# Patient Record
Sex: Female | Born: 1950 | Race: White | Hispanic: No | Smoking: Never smoker
Health system: Southern US, Community
[De-identification: ages and names within clinical notes are randomized; demographics above are authoritative.]

## PROBLEM LIST (undated history)

## (undated) DIAGNOSIS — C4491 Basal cell carcinoma of skin, unspecified: Secondary | ICD-10-CM

## (undated) DIAGNOSIS — I471 Supraventricular tachycardia: Principal | ICD-10-CM

## (undated) DIAGNOSIS — R030 Elevated blood-pressure reading, without diagnosis of hypertension: Secondary | ICD-10-CM

## (undated) DIAGNOSIS — E063 Autoimmune thyroiditis: Secondary | ICD-10-CM

## (undated) DIAGNOSIS — Z9289 Personal history of other medical treatment: Secondary | ICD-10-CM

## (undated) DIAGNOSIS — C439 Malignant melanoma of skin, unspecified: Secondary | ICD-10-CM

## (undated) HISTORY — DX: Elevated blood-pressure reading, without diagnosis of hypertension: R03.0

## (undated) HISTORY — DX: Basal cell carcinoma of skin, unspecified: C44.91

## (undated) HISTORY — DX: Personal history of other medical treatment: Z92.89

## (undated) HISTORY — DX: Supraventricular tachycardia: I47.1

## (undated) HISTORY — DX: Autoimmune thyroiditis: E06.3

---

## 1998-05-15 ENCOUNTER — Ambulatory Visit (HOSPITAL_COMMUNITY): Admission: RE | Admit: 1998-05-15 | Discharge: 1998-05-15 | Payer: Self-pay | Admitting: Obstetrics & Gynecology

## 1999-05-28 ENCOUNTER — Other Ambulatory Visit: Admission: RE | Admit: 1999-05-28 | Discharge: 1999-05-28 | Payer: Self-pay | Admitting: Obstetrics & Gynecology

## 1999-05-28 ENCOUNTER — Ambulatory Visit (HOSPITAL_COMMUNITY): Admission: RE | Admit: 1999-05-28 | Discharge: 1999-05-28 | Payer: Self-pay | Admitting: Obstetrics & Gynecology

## 1999-05-28 ENCOUNTER — Encounter: Payer: Self-pay | Admitting: Obstetrics & Gynecology

## 2000-04-25 DIAGNOSIS — I471 Supraventricular tachycardia, unspecified: Secondary | ICD-10-CM

## 2000-04-25 HISTORY — DX: Supraventricular tachycardia: I47.1

## 2000-04-25 HISTORY — DX: Supraventricular tachycardia, unspecified: I47.10

## 2000-09-21 ENCOUNTER — Other Ambulatory Visit: Admission: RE | Admit: 2000-09-21 | Discharge: 2000-09-21 | Payer: Self-pay | Admitting: Obstetrics & Gynecology

## 2002-06-28 ENCOUNTER — Other Ambulatory Visit: Admission: RE | Admit: 2002-06-28 | Discharge: 2002-06-28 | Payer: Self-pay | Admitting: Obstetrics and Gynecology

## 2003-10-09 ENCOUNTER — Other Ambulatory Visit: Admission: RE | Admit: 2003-10-09 | Discharge: 2003-10-09 | Payer: Self-pay | Admitting: Obstetrics & Gynecology

## 2004-10-26 HISTORY — PX: NECK SURGERY: SHX720

## 2004-10-26 HISTORY — PX: BUNIONECTOMY: SHX129

## 2005-01-20 ENCOUNTER — Other Ambulatory Visit: Admission: RE | Admit: 2005-01-20 | Discharge: 2005-01-20 | Payer: Self-pay | Admitting: Obstetrics and Gynecology

## 2006-02-16 ENCOUNTER — Other Ambulatory Visit: Admission: RE | Admit: 2006-02-16 | Discharge: 2006-02-16 | Payer: Self-pay | Admitting: Obstetrics & Gynecology

## 2009-03-04 ENCOUNTER — Ambulatory Visit (HOSPITAL_COMMUNITY): Admission: RE | Admit: 2009-03-04 | Discharge: 2009-03-04 | Payer: Self-pay | Admitting: Pulmonary Disease

## 2009-03-12 ENCOUNTER — Encounter (INDEPENDENT_AMBULATORY_CARE_PROVIDER_SITE_OTHER): Payer: Self-pay | Admitting: Diagnostic Radiology

## 2009-03-12 ENCOUNTER — Ambulatory Visit (HOSPITAL_COMMUNITY): Admission: RE | Admit: 2009-03-12 | Discharge: 2009-03-12 | Payer: Self-pay | Admitting: Pulmonary Disease

## 2009-10-26 DIAGNOSIS — Z9289 Personal history of other medical treatment: Secondary | ICD-10-CM

## 2009-10-26 HISTORY — DX: Personal history of other medical treatment: Z92.89

## 2010-05-01 IMAGING — US US BIOPSY
1 series · 10 of 10 positions shown · non-contrast
Comparison: none

CLINICAL DATA: Cystic left thyroid mass with mural nodule

ULTRASOUND GUIDED FINE NEEDLE ASPIRATION BIOPSY OF THYROID NODULE:
TECHNIQUE: Written informed consent for procedure obtained after discussion of
procedure and risks.
Time-out protocol performed.
Left lobe thyroid mass containing cystic and solid components was
localized by ultrasound.
Mass measures 2.8 x 1.8 by 1.9 cm in size.

[Series 1: unknown · 0.07mm/px · 10 of 10 slices shown]
[im 1/10]
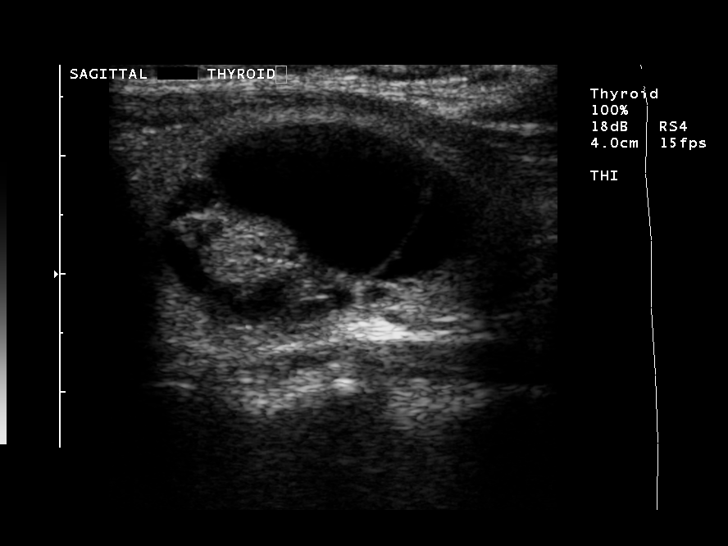
[im 2/10]
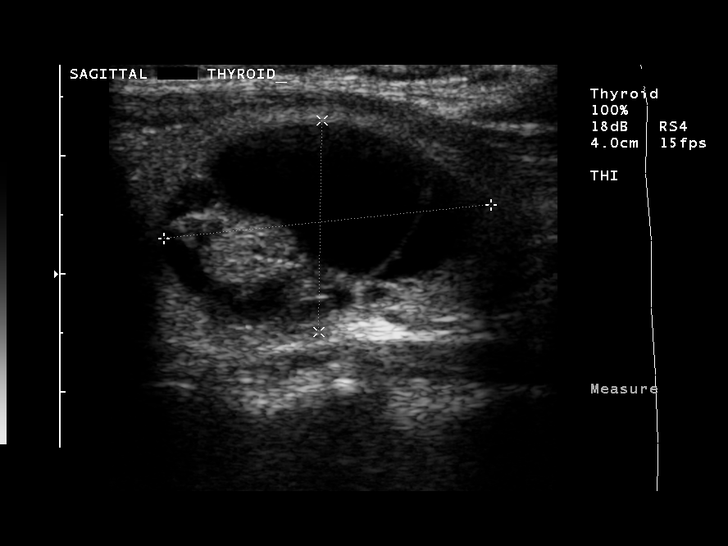
[im 3/10]
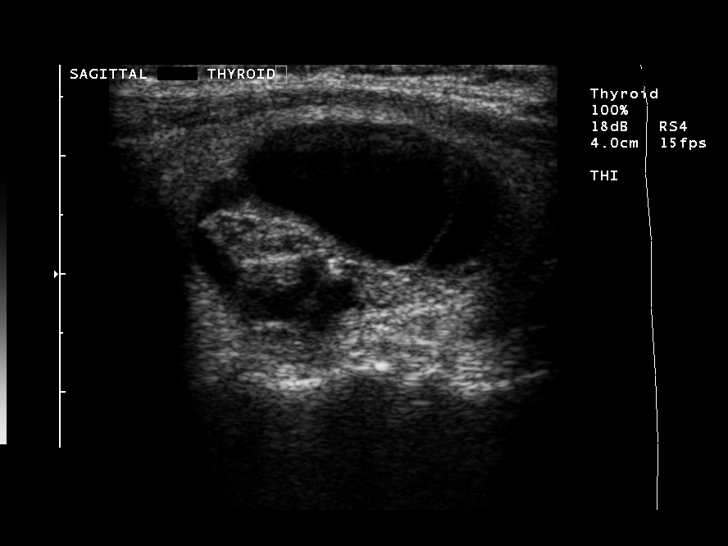
[im 4/10]
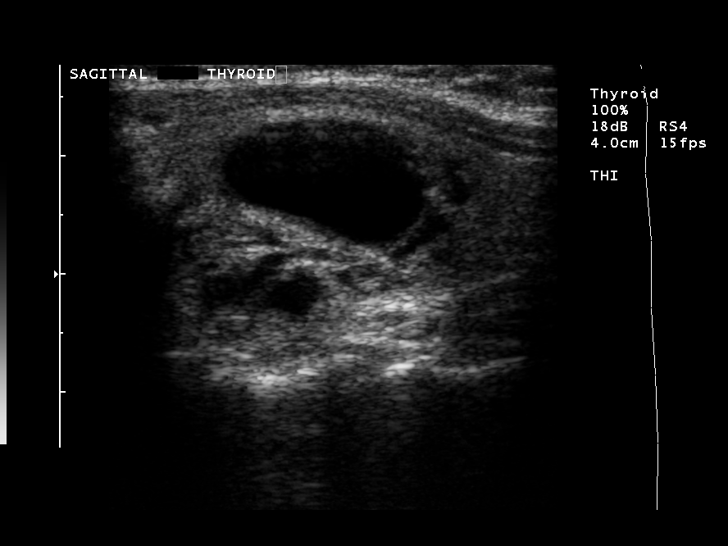
[im 5/10]
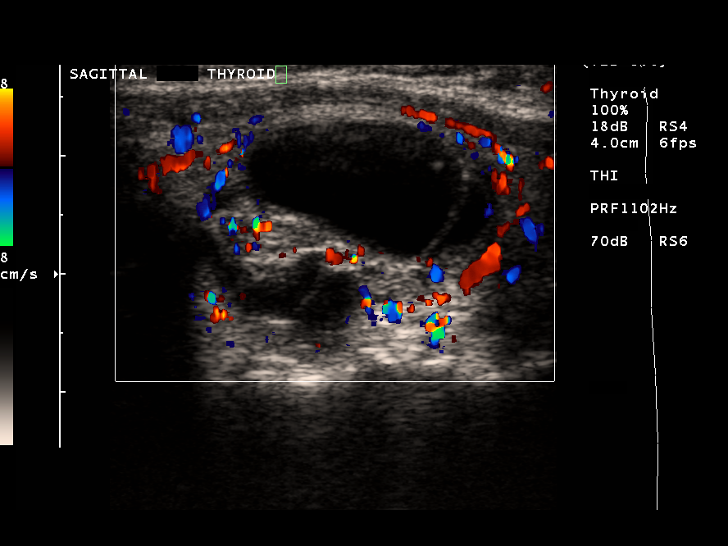
[im 6/10]
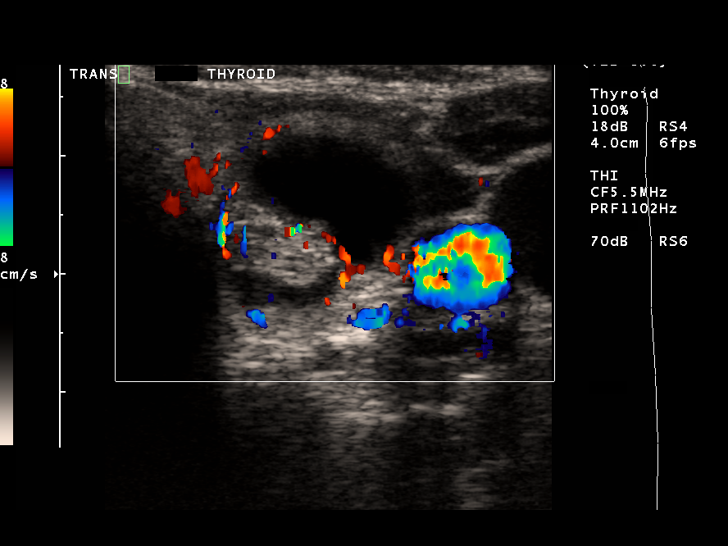
[im 7/10]
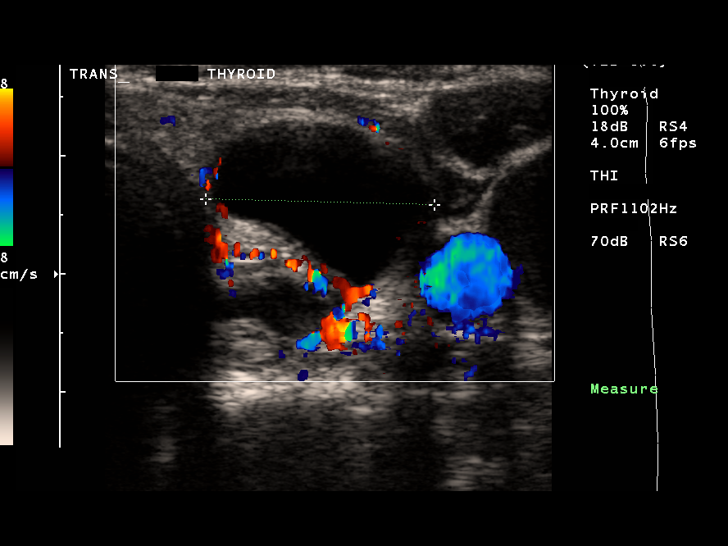
[im 8/10]
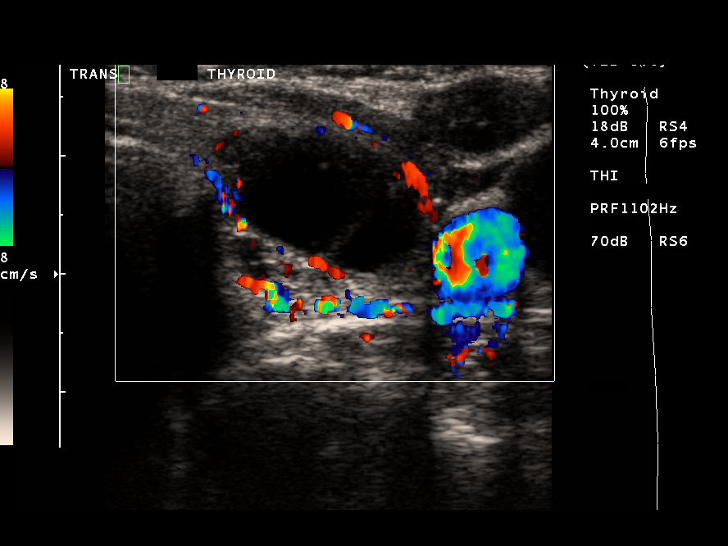
[im 9/10]
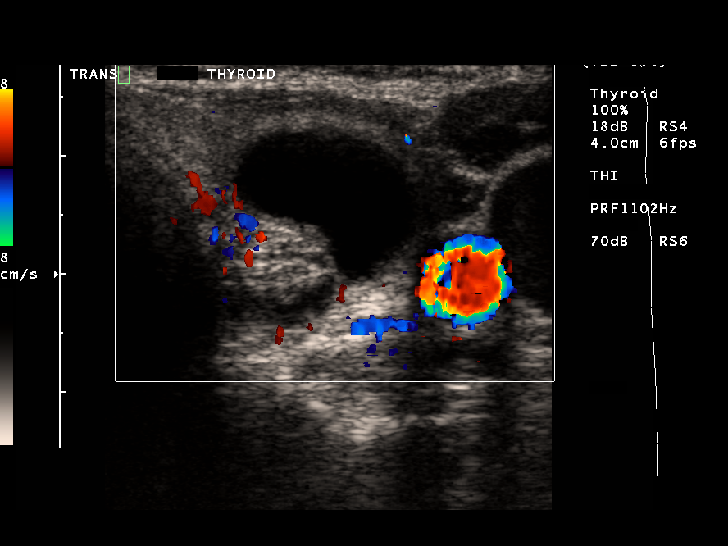
[im 10/10]
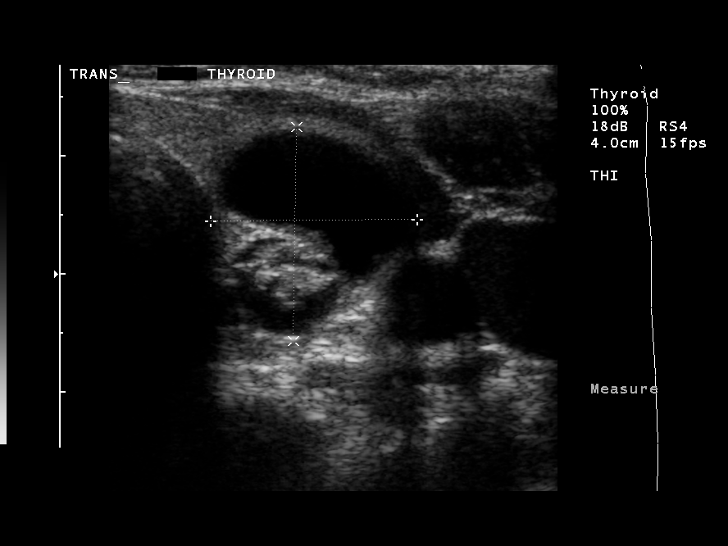

[10 of 10 positions shown; findings below may reference images not displayed]

Skin prepped and draped in usual sterile fashion.
Skin and soft tissues anesthestized with 1.5 ml of 1% lidocaine.
Under sonographic guidance, three 25-gauge fine needle aspiration
biopsies of the mural nodule of the left thyroid mass were
performed.
Additionally, following the three FNA samples, aspiration of the
cystic component was performed.
Procedure tolerated well by patient without immediate complication.
Postprocedural images demonstrate small amount of bleeding
internally into the aspirated cystic cavity.
Standard post procedure instructions were given to patient.
Specimen sent to laboratory for analysis.
IMPRESSION: Ultrasound guided fine needle aspiration biopsy of a complicated
cystic left lobe thyroid mass.

## 2010-06-24 ENCOUNTER — Encounter: Admission: RE | Admit: 2010-06-24 | Discharge: 2010-06-24 | Payer: Self-pay | Admitting: Obstetrics & Gynecology

## 2011-05-29 ENCOUNTER — Other Ambulatory Visit (HOSPITAL_COMMUNITY): Payer: Self-pay | Admitting: Internal Medicine

## 2011-05-29 ENCOUNTER — Ambulatory Visit (HOSPITAL_COMMUNITY)
Admission: RE | Admit: 2011-05-29 | Discharge: 2011-05-29 | Disposition: A | Discharge status: Home / Self Care | Payer: BC Managed Care – PPO | Source: Ambulatory Visit | Attending: Internal Medicine | Admitting: Internal Medicine

## 2011-05-29 DIAGNOSIS — R05 Cough: Secondary | ICD-10-CM | POA: Insufficient documentation

## 2011-05-29 DIAGNOSIS — R059 Cough, unspecified: Secondary | ICD-10-CM | POA: Insufficient documentation

## 2011-05-29 DIAGNOSIS — R918 Other nonspecific abnormal finding of lung field: Secondary | ICD-10-CM | POA: Insufficient documentation

## 2011-05-29 DIAGNOSIS — R52 Pain, unspecified: Secondary | ICD-10-CM

## 2011-05-29 DIAGNOSIS — R0789 Other chest pain: Secondary | ICD-10-CM | POA: Insufficient documentation

## 2011-05-29 DIAGNOSIS — M25519 Pain in unspecified shoulder: Secondary | ICD-10-CM | POA: Insufficient documentation

## 2011-06-22 ENCOUNTER — Other Ambulatory Visit (HOSPITAL_COMMUNITY): Payer: Self-pay | Admitting: Internal Medicine

## 2011-06-22 ENCOUNTER — Ambulatory Visit (HOSPITAL_COMMUNITY)
Admission: RE | Admit: 2011-06-22 | Discharge: 2011-06-22 | Disposition: A | Discharge status: Home / Self Care | Payer: BC Managed Care – PPO | Source: Ambulatory Visit | Attending: Internal Medicine | Admitting: Internal Medicine

## 2011-06-22 DIAGNOSIS — R0789 Other chest pain: Secondary | ICD-10-CM | POA: Insufficient documentation

## 2011-06-22 DIAGNOSIS — Z8701 Personal history of pneumonia (recurrent): Secondary | ICD-10-CM | POA: Insufficient documentation

## 2011-06-22 DIAGNOSIS — I10 Essential (primary) hypertension: Secondary | ICD-10-CM | POA: Insufficient documentation

## 2011-07-07 ENCOUNTER — Ambulatory Visit (HOSPITAL_COMMUNITY)
Admission: RE | Admit: 2011-07-07 | Discharge: 2011-07-07 | Disposition: A | Discharge status: Home / Self Care | Payer: BC Managed Care – PPO | Source: Ambulatory Visit | Attending: Internal Medicine | Admitting: Internal Medicine

## 2011-07-07 ENCOUNTER — Other Ambulatory Visit (HOSPITAL_COMMUNITY): Payer: Self-pay | Admitting: Internal Medicine

## 2011-07-07 DIAGNOSIS — R918 Other nonspecific abnormal finding of lung field: Secondary | ICD-10-CM | POA: Insufficient documentation

## 2011-07-07 DIAGNOSIS — J189 Pneumonia, unspecified organism: Secondary | ICD-10-CM

## 2011-07-07 DIAGNOSIS — R0789 Other chest pain: Secondary | ICD-10-CM | POA: Insufficient documentation

## 2012-10-06 ENCOUNTER — Other Ambulatory Visit: Payer: Self-pay | Admitting: Obstetrics & Gynecology

## 2012-10-06 DIAGNOSIS — R928 Other abnormal and inconclusive findings on diagnostic imaging of breast: Secondary | ICD-10-CM

## 2012-10-11 ENCOUNTER — Ambulatory Visit
Admission: RE | Admit: 2012-10-11 | Discharge: 2012-10-11 | Disposition: A | Discharge status: Home / Self Care | Payer: BC Managed Care – PPO | Source: Ambulatory Visit | Attending: Obstetrics & Gynecology | Admitting: Obstetrics & Gynecology

## 2012-10-11 DIAGNOSIS — R928 Other abnormal and inconclusive findings on diagnostic imaging of breast: Secondary | ICD-10-CM

## 2012-10-14 ENCOUNTER — Other Ambulatory Visit: Payer: BC Managed Care – PPO

## 2013-03-08 ENCOUNTER — Other Ambulatory Visit: Payer: Self-pay | Admitting: Obstetrics & Gynecology

## 2013-03-08 DIAGNOSIS — R921 Mammographic calcification found on diagnostic imaging of breast: Secondary | ICD-10-CM

## 2013-04-07 ENCOUNTER — Ambulatory Visit
Admission: RE | Admit: 2013-04-07 | Discharge: 2013-04-07 | Disposition: A | Discharge status: Home / Self Care | Payer: BC Managed Care – PPO | Source: Ambulatory Visit | Attending: Obstetrics & Gynecology | Admitting: Obstetrics & Gynecology

## 2013-04-07 DIAGNOSIS — R921 Mammographic calcification found on diagnostic imaging of breast: Secondary | ICD-10-CM

## 2013-07-06 ENCOUNTER — Encounter: Payer: Self-pay | Admitting: Cardiology

## 2013-07-06 ENCOUNTER — Ambulatory Visit (INDEPENDENT_AMBULATORY_CARE_PROVIDER_SITE_OTHER): Payer: BC Managed Care – PPO | Admitting: Cardiology

## 2013-07-06 VITALS — BP 140/88 | HR 51 | Ht 62.5 in | Wt 119.3 lb

## 2013-07-06 DIAGNOSIS — I471 Supraventricular tachycardia: Secondary | ICD-10-CM

## 2013-07-06 DIAGNOSIS — R030 Elevated blood-pressure reading, without diagnosis of hypertension: Secondary | ICD-10-CM

## 2013-07-06 NOTE — Patient Instructions (Addendum)
If you have extra beats  You may split you dose   Metoprolol and extra dose   Your physician wants you to follow-up in [address] Dr Leola Brazil will receive a reminder letter in the mail two months in advance. If you don't receive a letter, please call our office to schedule the follow-up appointment.

## 2013-07-07 ENCOUNTER — Encounter: Payer: Self-pay | Admitting: Cardiology

## 2013-07-07 ENCOUNTER — Other Ambulatory Visit: Payer: Self-pay | Admitting: Dermatology

## 2013-07-11 ENCOUNTER — Other Ambulatory Visit: Payer: Self-pay | Admitting: Cardiology

## 2013-07-11 NOTE — Telephone Encounter (Signed)
Rx was sent to pharmacy electronically. 

## 2013-07-17 ENCOUNTER — Encounter: Payer: Self-pay | Admitting: Cardiology

## 2013-07-17 DIAGNOSIS — R030 Elevated blood-pressure reading, without diagnosis of hypertension: Secondary | ICD-10-CM | POA: Insufficient documentation

## 2013-07-17 DIAGNOSIS — E063 Autoimmune thyroiditis: Secondary | ICD-10-CM | POA: Insufficient documentation

## 2013-07-17 NOTE — Progress Notes (Signed)
PCP: Carylon Perches, MD  Clinic Note: Chief Complaint  Patient presents with  . ROV 1 year    Heart rate increases at times, she can feel when she goes into SVT, has sensations in her legs at night and irritation on lower legs-was told it had something to do with circulation    HPI: Morgan [name] is a 62 y.o. female with a PMH below who presents today for followup of her SVT and PACs. She's been up titrated to 50 mg twice a day of metoprolol tartrate  Interval History: [name] presents today doing quite well overall from a cardiac standpoint. She still has occasional episodes where she feels her heart will go up and go quite fast. Usually, she can break them with cough or Valsalva maneuver. They don't last too long, and not enough to cause near-syncope or syncope symptoms.  Otherwise, she does as before significantly improved energy levels on the beta blocker. He is able to exercise with greater frequency. She denies any chest pain or shortness breath at rest or exertion no dyspnea at rest or exertion.  Despite her rapid heart rate, she denies any significant dizziness, wooziness, syncope or Near-syncope. No TIA/amaurosis fugax symptoms.  The remainder of Cardiovascular ROS: negative for - edema, loss of consciousness, murmur, orthopnea or paroxysmal nocturnal dyspnea is as follows: Additional cardiac review of systems: Melena - no, hematochezia no; hematuria - no; nosebleeds - no; claudication - no  Past Medical History  Diagnosis Date  . 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 Borderline hypertension   . Hashimoto's thyroiditis   . Basal cell carcinoma   . H/O echocardiogram 2011    Normal ejection fraction greater than 50%. No wall motion abnormalities no valvular lesions.    Prior Cardiac Evaluation and Past Surgical History: Past Surgical History  Procedure Laterality Date  . Bunionectomy  2006  . Neck surgery   2006    Cosmetic    No Known Allergies  Current Outpatient Prescriptions  Medication Sig Dispense Refill  . aspirin EC 81 MG tablet Take 81 mg by mouth daily.      . calcium carbonate (OS-CAL) 600 MG TABS tablet Take 600 mg by mouth daily.      . cholecalciferol (VITAMIN D) 1000 UNITS tablet Take 1,000 Units by mouth daily.      Marland Kitchen estradiol (ESTRACE) 1 MG tablet Take 1 tablet by mouth daily.      . fish oil-omega-3 fatty acids 1000 MG capsule Take 1 g by mouth daily.      . Multiple Vitamin (MULTIVITAMIN) tablet Take 1 tablet by mouth daily.      Marland Kitchen PROMETRIUM 200 MG capsule Take 1 capsule by mouth every 3 (three) months.      . vitamin E 400 UNIT capsule Take 400 Units by mouth daily.      . metoprolol (LOPRESSOR) 50 MG tablet take 1 tablet by mouth twice a day  60 tablet  11   No current facility-administered medications for this visit.    History   Social History Narrative   Married, mother of 3.   Actively exercising, enjoys running and doing aerobics at the gym. Actually notes that her energy while being on a beta blocker then off.   She exercises in the gym and also runs outside.   Does not smoke, or drink alcohol.    ROS: A comprehensive Review of Systems -  Negative except Pertinent positives noted above and mild symptom below Neurological ROS: Intermittent tingling in her legs and arms.  PHYSICAL EXAM BP 140/88  Pulse 51  Ht 5' 2.5" (1.588 m)  Wt 119 lb 4.8 oz (54.114 kg)  BMI 21.46 kg/m2 General appearance: alert, cooperative, appears stated age, no distress and Healthy-appearing, normal mood and affect. Well-nourished and well-groomed. Neck: no adenopathy, no carotid bruit, no JVD, supple, symmetrical, trachea midline and thyroid not enlarged, symmetric, no tenderness/mass/nodules Lungs: clear to auscultation bilaterally, normal percussion bilaterally and Nonlabored, good air movement Heart: regular rate and rhythm, S1, S2 normal, no murmur, click, rub or gallop and  normal apical impulse Abdomen: soft, non-tender; bowel sounds normal; no masses,  no organomegaly Extremities: extremities normal, atraumatic, no cyanosis or edema, no edema, redness or tenderness in the calves or thighs and no ulcers, gangrene or trophic changes Pulses: 2+ and symmetric Neurologic: Alert and oriented X 3, normal strength and tone. Normal symmetric reflexes. Normal coordination and gait HEENT: [place]/AT, EOMI, MMM, anicteric sclera   WUJ:[id number] today: Yes Rate: 51 , Rhythm: Sinus bradycardia, otherwise normal ECG;   Recent Labs: None  ASSESSMENT / PLAN: Paroxysmal SVT (supraventricular tachycardia) She is overall doing fairly well. Her family is somewhat concerned that these could be more serious. They have been confused with atrial fibrillation.  I reassured her that it is okay.  We went over maneuvers so that a break these  rhythms and when they occur. I simply cannot increase the beta blocker anymore. As he's not having any syncope or near-syncope symptoms with them I don't think there is any need to send her for ablation. When I did tell her she can do is potentially split her metoprolol and take one half a pill early if she has an episode of headache the remainder the dose as normally scheduled. She could even potentially take a full pill as normally scheduled if she is not noticing any lightheadedness or dizziness.  I did remind her that any prolonged episodes of very beneficial for emergent subjective to break the rhythm.  Borderline hypertension Her blood pressure is actually a bit high today. Has not been high in the past, she may simply be more sedated today or anxious.  I am not overly inclined on treating this low level of blood pressure as she is currently on a pre-high-dose of metoprolol. I will continue her current regimen. I don't think she has any more room to increase beta blocker dose.     No orders of the defined types were placed in this encounter.     Meds ordered this encounter  Medications  . estradiol (ESTRACE) 1 MG tablet    Sig: Take 1 tablet by mouth daily.  Marland Kitchen DISCONTD: metoprolol (LOPRESSOR) 50 MG tablet    Sig: Take 1 tablet by mouth 2 (two) times daily.  Marland Kitchen PROMETRIUM 200 MG capsule    Sig: Take 1 capsule by mouth every 3 (three) months.  . Multiple Vitamin (MULTIVITAMIN) tablet    Sig: Take 1 tablet by mouth daily.  . calcium carbonate (OS-CAL) 600 MG TABS tablet    Sig: Take 600 mg by mouth daily.  . cholecalciferol (VITAMIN D) 1000 UNITS tablet    Sig: Take 1,000 Units by mouth daily.  . vitamin E 400 UNIT capsule    Sig: Take 400 Units by mouth daily.  . fish oil-omega-3 fatty acids 1000 MG capsule    Sig: Take 1 g by mouth daily.  Marland Kitchen aspirin  EC 81 MG tablet    Sig: Take 81 mg by mouth daily.    Followup: One year  Lyle Leisner W. Herbie Baltimore, M.D., M.S. [facility] [address] [place], [place]  [address]  [phone] Pager # [phone]

## 2013-07-17 NOTE — Assessment & Plan Note (Signed)
Her blood pressure is actually a bit high today. Has not been high in the past, she may simply be more sedated today or anxious.  I am not overly inclined on treating this low level of blood pressure as she is currently on a pre-high-dose of metoprolol. I will continue her current regimen. I don't think she has any more room to increase beta blocker dose.

## 2013-07-17 NOTE — Assessment & Plan Note (Signed)
She is overall doing fairly well. Her family is somewhat concerned that these could be more serious. They have been confused with atrial fibrillation.  I reassured her that it is okay.  We went over maneuvers so that a break these  rhythms and when they occur. I simply cannot increase the beta blocker anymore. As he's not having any syncope or near-syncope symptoms with them I don't think there is any need to send her for ablation. When I did tell her she can do is potentially split her metoprolol and take one half a pill early if she has an episode of headache the remainder the dose as normally scheduled. She could even potentially take a full pill as normally scheduled if she is not noticing any lightheadedness or dizziness.  I did remind her that any prolonged episodes of very beneficial for emergent subjective to break the rhythm.

## 2013-10-02 ENCOUNTER — Other Ambulatory Visit: Payer: Self-pay | Admitting: Obstetrics & Gynecology

## 2013-10-02 DIAGNOSIS — R921 Mammographic calcification found on diagnostic imaging of breast: Secondary | ICD-10-CM

## 2013-10-27 ENCOUNTER — Ambulatory Visit
Admission: RE | Admit: 2013-10-27 | Discharge: 2013-10-27 | Disposition: A | Discharge status: Home / Self Care | Payer: BC Managed Care – PPO | Source: Ambulatory Visit | Attending: Obstetrics & Gynecology | Admitting: Obstetrics & Gynecology

## 2013-10-27 DIAGNOSIS — R921 Mammographic calcification found on diagnostic imaging of breast: Secondary | ICD-10-CM

## 2014-01-09 ENCOUNTER — Other Ambulatory Visit: Payer: Self-pay | Admitting: Dermatology

## 2014-02-07 ENCOUNTER — Other Ambulatory Visit: Payer: Self-pay | Admitting: Dermatology

## 2014-02-21 ENCOUNTER — Other Ambulatory Visit: Payer: Self-pay | Admitting: Dermatology

## 2014-05-16 ENCOUNTER — Telehealth: Payer: Self-pay | Admitting: Cardiology

## 2014-05-16 NOTE — Telephone Encounter (Signed)
Closed encounter °

## 2014-05-27 IMAGING — MG MM DIGITAL DIAGNOSTIC UNILAT*R*
4 series · 4 of 4 positions shown · non-contrast
Comparison: [DATE] [DATE], [DATE], [DATE] [DATE], [DATE], [DATE] [DATE], [DATE],
[DATE] [DATE], [DATE], [DATE] [DATE], [DATE]

CLINICAL DATA: 6-month follow-up calcifications right breast

DIGITAL DIAGNOSTIC RIGHT MAMMOGRAM WITH CAD

[R CC (1 of 2)]
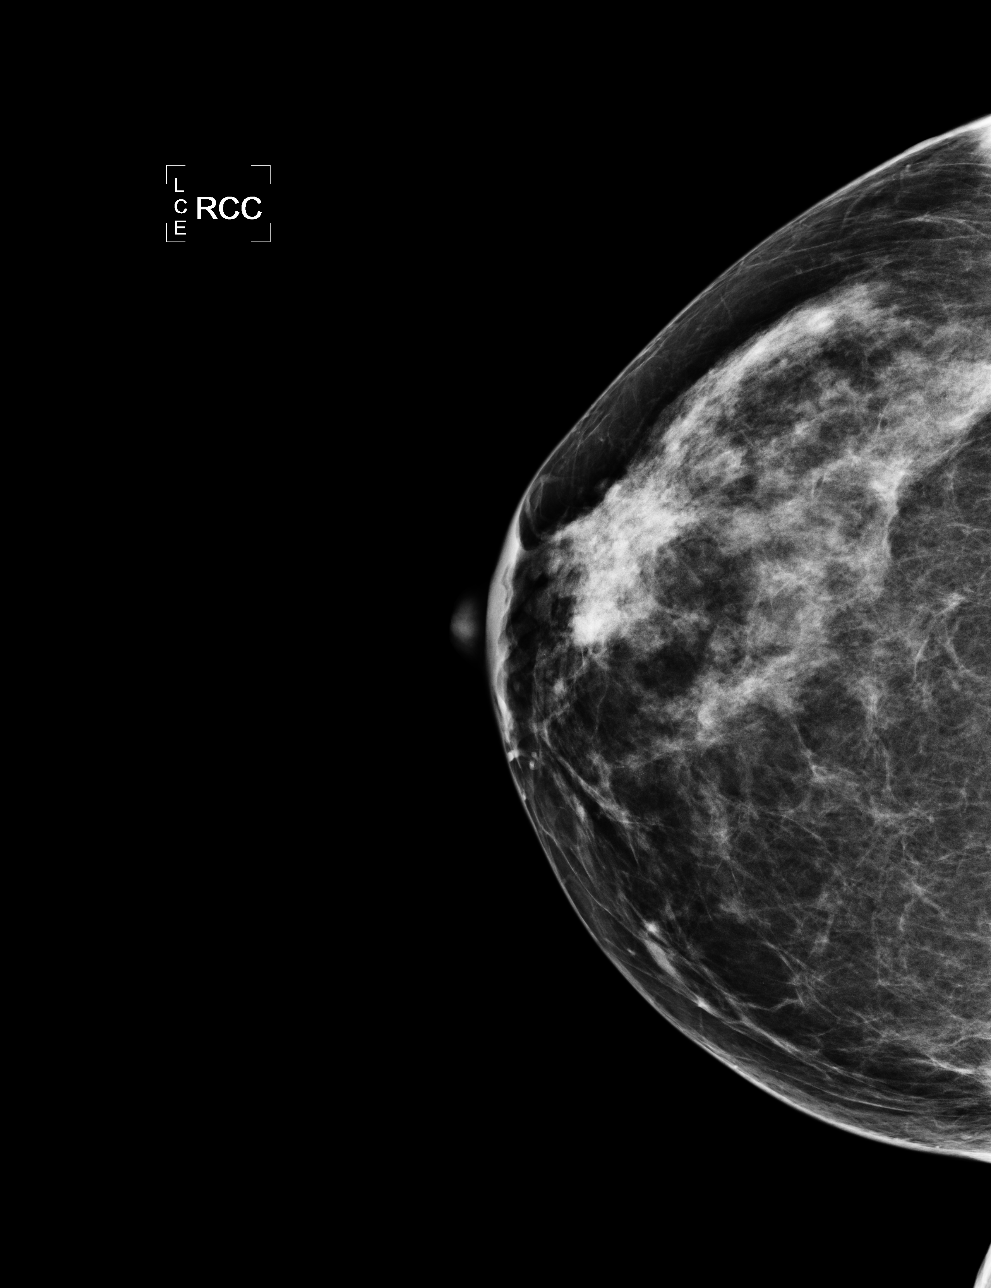

[R MLO]
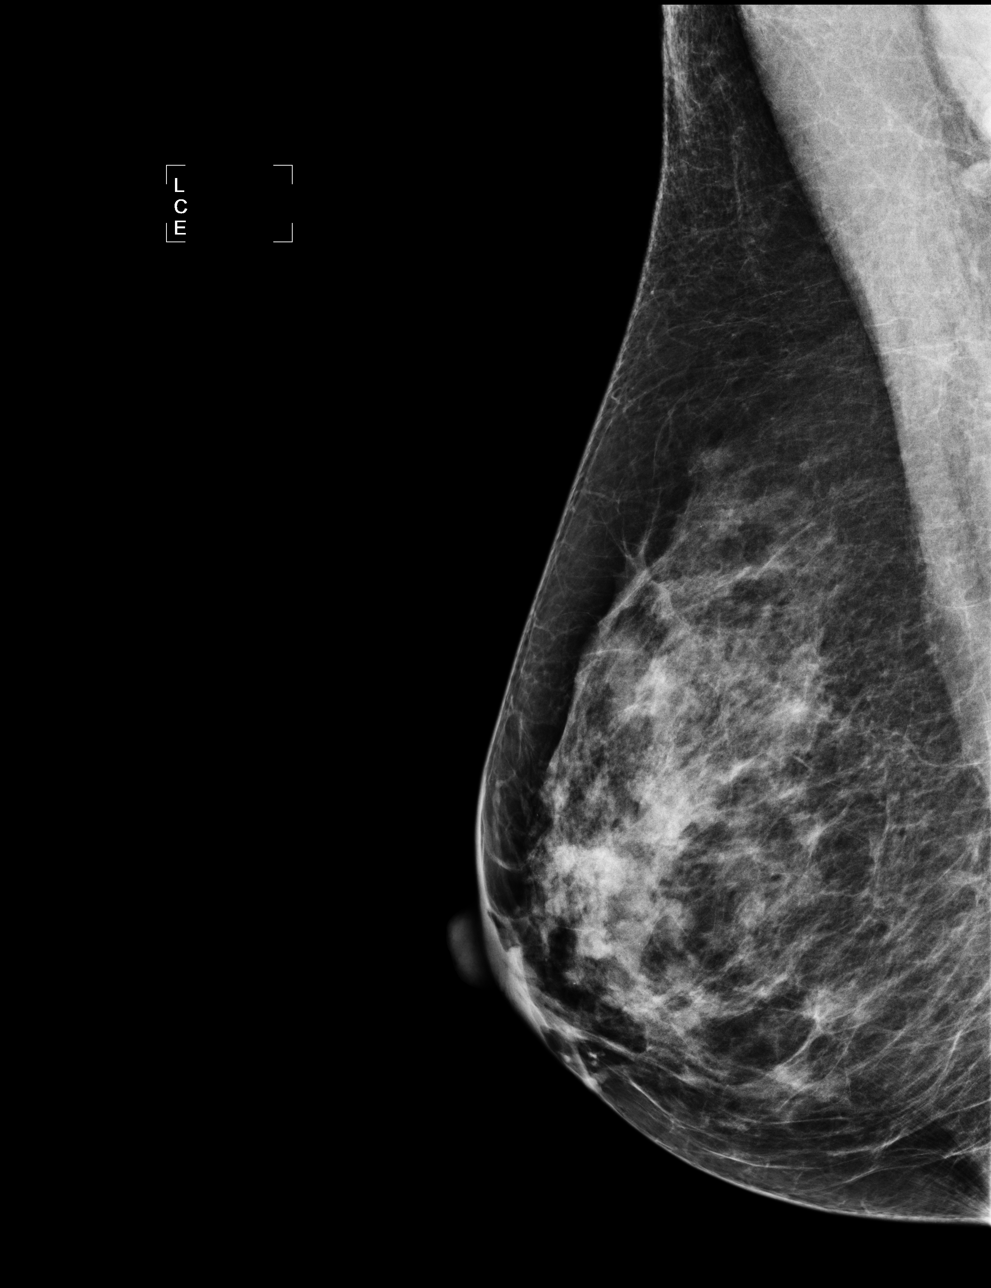

[R CC (2 of 2)]
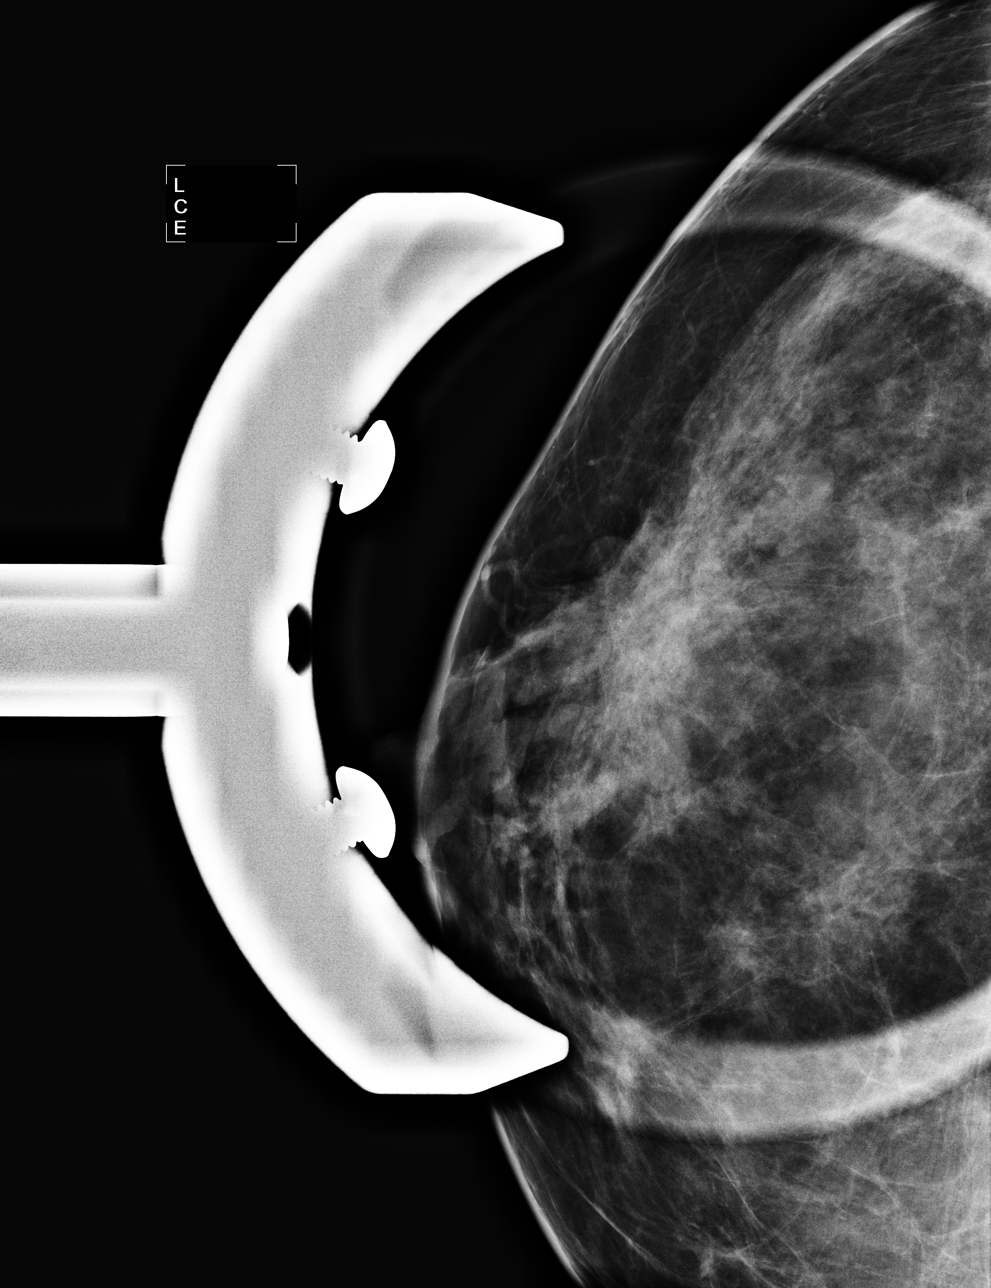

[R ML]
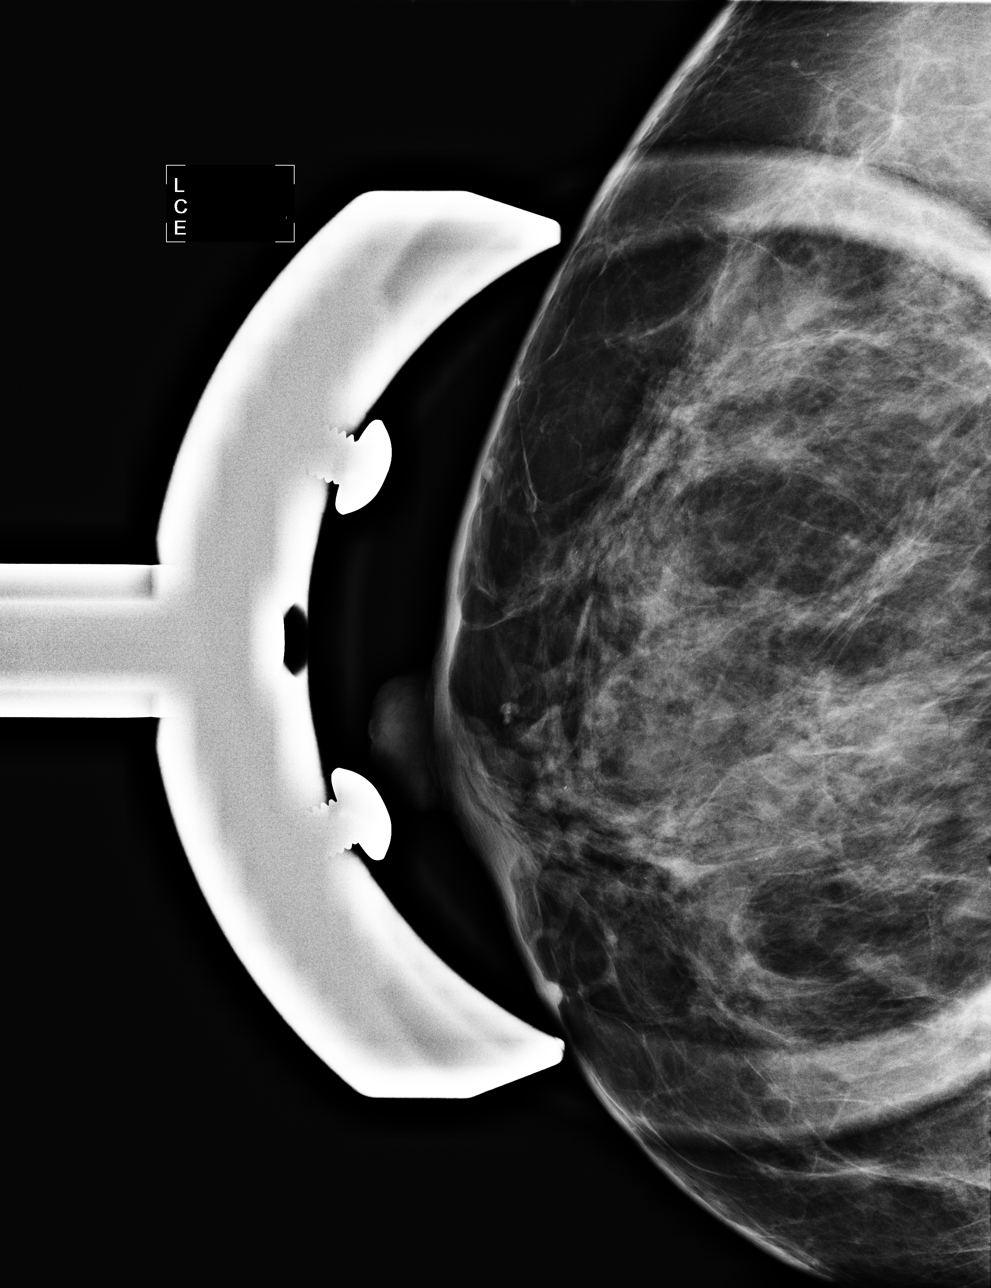

[4 of 4 positions shown; findings below may reference images not displayed]

FINDINGS: ACR Breast Density Category heterogeneously dense

CC and MLO views of the right breast, spot magnification CC and
lateral views of the right breast are submitted.  Stable scattered
calcifications are identified in the retroareolar right breast
unchanged compared prior exam.

Mammographic images were processed with CAD.
IMPRESSION: Probable benign findings.

RECOMMENDATION:
6-month follow-up mammogram right breast.

I have discussed the findings and recommendations with the patient.
Results were also provided in writing at the conclusion of the
visit.  If applicable, a reminder letter will be sent to the
patient regarding her next appointment.

BI-RADS CATEGORY 3:  Probably benign finding(s) - short interval
follow-up suggested.

## 2014-06-01 ENCOUNTER — Ambulatory Visit (INDEPENDENT_AMBULATORY_CARE_PROVIDER_SITE_OTHER): Payer: BC Managed Care – PPO

## 2014-06-01 VITALS — BP 124/58 | HR 53 | Resp 16

## 2014-06-01 DIAGNOSIS — M779 Enthesopathy, unspecified: Secondary | ICD-10-CM

## 2014-06-01 DIAGNOSIS — M2040 Other hammer toe(s) (acquired), unspecified foot: Secondary | ICD-10-CM

## 2014-06-01 DIAGNOSIS — M2010 Hallux valgus (acquired), unspecified foot: Secondary | ICD-10-CM

## 2014-06-01 DIAGNOSIS — M778 Other enthesopathies, not elsewhere classified: Secondary | ICD-10-CM

## 2014-06-01 DIAGNOSIS — M7750 Other enthesopathy of unspecified foot: Secondary | ICD-10-CM

## 2014-06-01 NOTE — Patient Instructions (Signed)
Bunion You have a bunion deformity of the feet. This is more common in women. It tends to be an inherited problem. Symptoms can include pain, swelling, and deformity around the great toe. Numbness and tingling may also be present. Your symptoms are often worsened by wearing shoes that cause pressure on the bunion. Changing the type of shoes you wear helps reduce symptoms. A wide shoe decreases pressure on the bunion. An arch support may be used if you have flat feet. Avoid shoes with heels higher than two inches. This puts more pressure on the bunion. X-rays may be helpful in evaluating the severity of the problem. Other foot problems often seen with bunions include corns, calluses, and hammer toes. If the deformity or pain is severe, surgical treatment may be necessary. Keep off your painful foot as much as possible until the pain is relieved. Call your caregiver if your symptoms are worse.  SEEK IMMEDIATE MEDICAL CARE IF:  You have increased redness, pain, swelling, or other symptoms of infection. Document Released: 10/12/2005 Document Revised: 01/04/2012 Document Reviewed: 04/11/2007 Exit[employer] Patient Information 2015 [employer]. This information is not intended to replace advice given to you by your health care provider. Make sure you discuss any questions you have with your health care provider.  

## 2014-06-01 NOTE — Progress Notes (Signed)
Subjective:    Patient ID: Morgan [name], female    DOB: Apr 02, 1951, 63 y.o.   MRN: [id number]  HPI Comments: "I have some questions regarding my feet and whether I should have surgery"  Patient states that she had surgery with Dr. Janus Molder in 2011. She had bunion and hammer toe surgery. She says that she feels a knot along the 1st met right and the 2nd toe right lifts and sits above the 1st and 3rd toes. The 3rd toe right is curled under.    She also is interested in getting the bunion fixed on the left foot.   Patient states that she is not having any pain with either foot, but wants to know her options while she is still healthy enough to have surgery.  Foot Pain      Review of Systems  Genitourinary: Positive for hematuria.  All other systems reviewed and are negative.      Objective:   Physical Exam This 63 year old white female presents at this time well-developed well-nourished oriented x3. Does have complaints of some digital issues hammertoes and bunion issues of both feet has had previous surgery on the right however has residual dorsal contracture the second digit as well as possibly some recurrence of the hallux valgus. 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 Next  Looking objective findings as follows vascular status is intact with pedal pulses palpable DP +2/4 PT +2/4 bilateral capillary refill time 3 seconds all digits skin temperature is warm turgor normal no edema rubor pallor noted epicritic and proprioceptive sensations intact and symmetric bilateral there is normal plantar response and DTRs. Dermatologically skin color pigment normal hair growth absent nails show some slight yellowing and discoloration  otherwise unremarkable. There healed incision scars first and second toes right foot the hallux still show some slight lateral deviation. One other concern was the fact that the pins from the eye and deformity are also somewhat prominent and causing discomfort in certain shoes at all times. X-rays taken at this time demonstrate slight rotation of the K wire in the area of lesion marker to this is not constant pain only with certain shoes activities. These wires been removed at patient's convenience or request at some point in the future as needed  Patient does have notable bunion deformity with hallux abductovalgus as well as Freiberg contractures confirmed with flattening of second metatarsal head on AP and oblique views left foot increased IM angle 13 on the left hallux abductus angle 25 sesamoid position 6 with adductovarus rotated second third fourth and fifth digits on the left foot noted as well. Should note both feet are relatively asymptomatic according to page she runs she wears dress shoes and heels without any pain or discomfort her only significant concern was the deformity of the toes especially the open toe shoe or sandal.       Assessment & Plan:  Assessment this time patient does have some residual contracture at the MTP joint second right adductovarus rotation with hammertoe deformities 34 and 5 on the right foot. There is also some residual bunion deformity and rotated K wire which may be candidate for removal at some point in the future. 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 At this time I discussed with the patient and she is not having pain or symptomology functioning normally and all the shoes and activity she wants to do, my recommendation is to monitor for changes not doing surgery at this time unless symptoms persist or recur. If she continues to  have symptoms or develops pain or symptomology of any of these areas recommendations for surgery would include following K wire removal at her convenience to be done an outpatient basis in the office local block. Reviewing of the surgery on the right foot including hammertoe repair of 34 and 5 and tenotomy capsulotomy of the first and second MTP joints would be consideration her patient is also advised may leave her with more scar tissue painful rigid joints and end up limiting her function or walk in certain shoes. Patient will also be candidate for [facility] bunionectomy left foot and possibly hammertoe repairs if needed or even implant arthroplasty if the MTP joint became symptomatic on the second. Literature on bunion surgery given to the patient and I suggested followup in the future on an as-needed basis if and when she is ready for surgery next progress Morgan [name] DPM

## 2014-07-05 ENCOUNTER — Telehealth: Payer: Self-pay | Admitting: Cardiology

## 2014-07-09 ENCOUNTER — Other Ambulatory Visit: Payer: Self-pay | Admitting: *Deleted

## 2014-07-09 ENCOUNTER — Ambulatory Visit: Payer: BC Managed Care – PPO | Admitting: Cardiology

## 2014-07-09 MED ORDER — METOPROLOL TARTRATE 50 MG PO TABS: 50.0000 mg | ORAL_TABLET | Freq: Two times a day (BID) | ORAL | Status: DC

## 2014-07-09 NOTE — Telephone Encounter (Signed)
Closed encounter °

## 2014-07-27 ENCOUNTER — Encounter: Payer: Self-pay | Admitting: *Deleted

## 2014-07-30 ENCOUNTER — Ambulatory Visit (INDEPENDENT_AMBULATORY_CARE_PROVIDER_SITE_OTHER): Payer: BC Managed Care – PPO | Admitting: Cardiology

## 2014-07-30 ENCOUNTER — Encounter: Payer: Self-pay | Admitting: Cardiology

## 2014-07-30 VITALS — BP 110/66 | HR 57 | Ht 63.0 in | Wt 120.6 lb

## 2014-07-30 DIAGNOSIS — I471 Supraventricular tachycardia: Secondary | ICD-10-CM

## 2014-07-30 DIAGNOSIS — R030 Elevated blood-pressure reading, without diagnosis of hypertension: Secondary | ICD-10-CM

## 2014-07-30 DIAGNOSIS — R0789 Other chest pain: Secondary | ICD-10-CM

## 2014-07-30 DIAGNOSIS — E063 Autoimmune thyroiditis: Secondary | ICD-10-CM

## 2014-07-30 NOTE — Patient Instructions (Signed)
CONTINUE  WITH CURRENT MEDICATIONS   Your physician wants you to follow-up in [place].  You will receive a reminder letter in the mail two months in advance. If you don't receive a letter, please call our office to schedule the follow-up appointment.

## 2014-07-30 NOTE — Progress Notes (Signed)
PCP: Asencion Noble, MD  Clinic Note: Chief Complaint  Patient presents with  . Annual Exam    rov 28yr; discomfort below R shoulder and R lower rib, drippy nose pt wonders if related to metoprolol, no other complaints   HPI: Morgan [name] is a 63 y.o. female with a PMH below who presents today for annual followup of SVT and PACs. I last saw her in September of 2014, and she is doing relatively well on the increased dose of metoprolol.  Past Medical History  Diagnosis Date  . 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 Borderline hypertension   . Hashimoto's thyroiditis   . Basal cell carcinoma   . H/O echocardiogram 2011    Normal ejection fraction greater than 50%. No wall motion abnormalities no valvular lesions.   Interval History: Chief access today doing quite well from a cardiovascular standpoint. She is very active now on runs almost every single day. She really denies any significant symptoms of rapid or irregular heartbeats. She does have very short fleeting palpitations and may be 10-15 second spells of rapid heart beats but nothing at all like she had before. No lightheadedness no dizziness, syncope or near syncope no TIA or amaurosis fugax. She has had some discomfort in her right upper chest and back. This is similar to the symptoms that she had last year when she came in with a pneumonia and was treated antibiotics. The pain was triggered again by a coughing spell that she had likely related to allergies. Is not at all associated with any exertion and is worse with deep inspiration and coughing.  ROS: A comprehensive was performed. Review of Systems  Constitutional: Negative for fever, chills and malaise/fatigue.  HENT: Positive for congestion. Negative for sore throat.        Nasal congestion and postnasal drip - I reassured her this is not a side effect of beta blockers.  Eyes: Negative for blurred vision,  double vision and redness.  Respiratory: Positive for cough. Negative for sputum production, shortness of breath and wheezing.   Cardiovascular: Positive for chest pain.       Per history of present illness  Gastrointestinal: Negative for heartburn, blood in stool and melena.  Genitourinary: Negative for frequency and hematuria.  Musculoskeletal: Negative for falls, joint pain and myalgias.  Neurological: Negative for tremors, sensory change, speech change, focal weakness, seizures, loss of consciousness and headaches.  Endo/Heme/Allergies: Does not bruise/bleed easily.  Psychiatric/Behavioral: Negative for depression. The patient is not nervous/anxious.   All other systems reviewed and are negative.   Current Outpatient Prescriptions on File Prior to Visit  Medication Sig Dispense Refill  . calcium carbonate (OS-CAL) 600 MG TABS tablet Take 600 mg by mouth daily.      . cholecalciferol (VITAMIN D) 1000 UNITS tablet Take 1,000 Units by mouth daily.      Marland Kitchen estradiol (ESTRACE) 1 MG tablet Take 1 tablet by mouth daily.      . fish oil-omega-3 fatty acids 1000 MG capsule Take 1 g by mouth daily.      . metoprolol (LOPRESSOR) 50 MG tablet Take 1 tablet (50 mg total) by mouth 2 (two) times daily.  60 tablet  3  . Multiple Vitamin (MULTIVITAMIN) tablet Take 1 tablet by mouth daily.      Marland Kitchen PROMETRIUM 200 MG capsule Take 1 capsule by mouth every 3 (three) months.      Marland Kitchen  vitamin E 400 UNIT capsule Take 400 Units by mouth daily.       No current facility-administered medications on file prior to visit.   ALLERGIES REVIEWED IN EPIC -- No change SOCIAL AND FAMILY HISTORY REVIEWED IN EPIC -- No change  Wt Readings from Last 3 Encounters:  07/30/14 120 lb 9.6 oz (54.704 kg)  07/06/13 119 lb 4.8 oz (54.114 kg)    PHYSICAL EXAM BP 110/66  Pulse 57  Ht 5\' 3"  (1.6 m)  Wt 120 lb 9.6 oz (54.704 kg)  BMI 21.37 kg/m2 General appearance: alert, cooperative, appears stated age, no distress and healthy  appearing. HEENT: [place]/AT, EOMI, MMM, anicteric sclera Neck: no adenopathy, no carotid bruit and no JVD Lungs:CTAB, normal percussion bilaterally and non-labored Heart: RRR, S1,&S2 normal, no murmurM/R/G; nondisplaced PMI Abdomen: soft, non-tender; bowel sounds normal; no masses,  no organomegaly;  Extremities: extremities normal, atraumatic, no cyanosis or edema Pulses: 2+ and symmetric Neurologic: Mental status: Alert, oriented, thought content appropriate Cranial nerves: normal (II-XII grossly intact)   Adult ECG Report  Rate: 57 ;  Rhythm: sinus bradycardia  Narrative Interpretation: Otherwise normal EKG  Recent Labs:  None available   ASSESSMENT / PLAN: Very stable with no recurrent significant symptoms of SVT. Tolerating beta blocker. Paroxysmal SVT (supraventricular tachycardia) Stable on current dose of beta blocker. Reviewed Vagal maneuvers. Also the when necessary half dose metoprolol for symptoms.  Borderline hypertension Blood pressures look great today on her metoprolol dose.  History of colon Hashimoto's thyroiditis By her report, no recurrence.  Right-sided chest wall pain Mr. pain is probably musculoskeletal, but could potentially be pleuritic type pain from scarring from her pneumonia in the past says it was similar to her pleuritic pain and. Clearly not cardiac. Suggested that she could use a intermittent ibuprofen.    Orders Placed This Encounter  Procedures  . EKG 12-Lead   No orders of the defined types were placed in this encounter.     Followup: One year   [name], M.D., M.S. Interventional Cardiologist   Pager # [phone]

## 2014-08-01 DIAGNOSIS — R0789 Other chest pain: Secondary | ICD-10-CM | POA: Insufficient documentation

## 2014-08-01 NOTE — Assessment & Plan Note (Signed)
By her report, no recurrence.

## 2014-08-01 NOTE — Assessment & Plan Note (Signed)
Mr. pain is probably musculoskeletal, but could potentially be pleuritic type pain from scarring from her pneumonia in the past says it was similar to her pleuritic pain and. Clearly not cardiac. Suggested that she could use a intermittent ibuprofen.

## 2014-08-01 NOTE — Assessment & Plan Note (Signed)
Stable on current dose of beta blocker. Reviewed Vagal maneuvers. Also the when necessary half dose metoprolol for symptoms.

## 2014-08-01 NOTE — Assessment & Plan Note (Signed)
Blood pressures look great today on her metoprolol dose.

## 2014-11-19 ENCOUNTER — Other Ambulatory Visit: Payer: Self-pay | Admitting: Cardiology

## 2014-11-19 NOTE — Telephone Encounter (Signed)
Rx(s) sent to pharmacy electronically.  

## 2014-11-21 ENCOUNTER — Other Ambulatory Visit: Payer: Self-pay | Admitting: Obstetrics & Gynecology

## 2014-11-22 LAB — CYTOLOGY - PAP

## 2015-01-29 ENCOUNTER — Other Ambulatory Visit: Payer: Self-pay | Admitting: Dermatology

## 2015-06-06 ENCOUNTER — Other Ambulatory Visit: Payer: Self-pay | Admitting: Obstetrics & Gynecology

## 2015-06-07 LAB — CYTOLOGY - PAP

## 2015-08-08 ENCOUNTER — Telehealth: Payer: Self-pay | Admitting: Cardiology

## 2015-08-08 NOTE — Telephone Encounter (Signed)
Closed encounter °

## 2015-08-12 ENCOUNTER — Encounter: Payer: Self-pay | Admitting: Cardiology

## 2015-08-12 ENCOUNTER — Ambulatory Visit (INDEPENDENT_AMBULATORY_CARE_PROVIDER_SITE_OTHER): Payer: BC Managed Care – PPO | Admitting: Cardiology

## 2015-08-12 VITALS — BP 110/60 | HR 59 | Ht 62.0 in | Wt 123.0 lb

## 2015-08-12 DIAGNOSIS — I471 Supraventricular tachycardia: Secondary | ICD-10-CM | POA: Diagnosis not present

## 2015-08-12 DIAGNOSIS — R030 Elevated blood-pressure reading, without diagnosis of hypertension: Secondary | ICD-10-CM | POA: Diagnosis not present

## 2015-08-12 NOTE — Progress Notes (Signed)
PCP: Asencion Noble, MD  Clinic Note: Chief Complaint  Patient presents with  . Annual Exam    Patient has no complaints., labs form 11/2014  . Tachycardia    History of SVT and palpitations   HPI: Morgan [name] is a 64 y.o. female with a PMH below who presents today for annual followup of SVT and PACs.   I last saw her in October of 2015, and she was doing relatively well on the increased dose of metoprolol.  She was having some R sided atypical / MSK type chest pain following a bout of Pneumonia.   Past Medical History  Diagnosis Date  . 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 Borderline hypertension   . Hashimoto's thyroiditis   . Basal cell carcinoma   . H/O echocardiogram 2011    Normal ejection fraction greater than 50%. No wall motion abnormalities no valvular lesions.   Interval History: Morgan [name] presents today really doing quite well without any major complaints.  She hurt one of the fingers on her right hand during a fall this weekend, while [place]. She really has not had any significant palpitations or rapid heartbeats. No fatigue or dizziness or dyspnea. Cardiovascular ROS: no chest pain or dyspnea on exertion negative for - edema, irregular heartbeat, loss of consciousness, orthopnea, palpitations, paroxysmal nocturnal dyspnea, rapid heart rate, shortness of breath or TIA/amaurosis fugax, near syncope/syncope   ROS: A comprehensive was performed. Review of Systems  Constitutional: Negative for fever, chills and malaise/fatigue.  HENT: Negative for congestion and sore throat.   Eyes: Negative for blurred vision, double vision and redness.  Respiratory: Negative for cough, sputum production, shortness of breath and wheezing.   Cardiovascular: Negative for chest pain.       Per history of present illness  Gastrointestinal: Negative for heartburn, blood in stool and  melena.       Occasional indigestion  Genitourinary: Negative for frequency and hematuria.  Musculoskeletal: Positive for joint pain (Finger on right hand). Negative for myalgias and falls.  Neurological: Negative for tremors, sensory change, speech change, focal weakness, seizures, loss of consciousness and headaches.  Endo/Heme/Allergies: Does not bruise/bleed easily.  Psychiatric/Behavioral: Negative for depression. The patient is not nervous/anxious.   All other systems reviewed and are negative.   Current Outpatient Prescriptions on File Prior to Visit  Medication Sig Dispense Refill  . calcium carbonate (OS-CAL) 600 MG TABS tablet Take 600 mg by mouth daily.    . cholecalciferol (VITAMIN D) 1000 UNITS tablet Take 1,000 Units by mouth daily.    Marland Kitchen estradiol (ESTRACE) 1 MG tablet Take 1 tablet by mouth daily.    . fish oil-omega-3 fatty acids 1000 MG capsule Take 1 g by mouth daily.    . metoprolol (LOPRESSOR) 50 MG tablet Take 1 tablet (50 mg total) by mouth 2 (two) times daily. 60 tablet 9  . Multiple Vitamin (MULTIVITAMIN) tablet Take 1 tablet by mouth daily.    Marland Kitchen PROMETRIUM 200 MG capsule Take 1 capsule by mouth every 3 (three) months.    . vitamin E 400 UNIT capsule Take 400 Units by mouth daily.     No current facility-administered medications on file prior to visit.   Allergies  Allergen Reactions  . Fish Allergy    Social History  Substance Use Topics  . Smoking status: Never Smoker   . Smokeless tobacco: Never Used  .  Alcohol Use: Yes     Comment: Socially   Family History  Problem Relation Age of Onset  . Alzheimer's disease Father   . Parkinson's disease Maternal Grandmother   . Heart Problems Paternal Grandmother     Wt Readings from Last 3 Encounters:  08/12/15 123 lb (55.792 kg)  07/30/14 120 lb 9.6 oz (54.704 kg)  07/06/13 119 lb 4.8 oz (54.114 kg)    PHYSICAL EXAM BP 110/60 mmHg  Pulse 59  Ht 5\' 2"  (1.575 m)  Wt 123 lb (55.792 kg)  BMI 22.49  kg/m2 General appearance: alert, cooperative, appears stated age, no distress and healthy appearing. HEENT: [place]/AT, EOMI, MMM, anicteric sclera Neck: no adenopathy, no carotid bruit and no JVD Lungs:CTAB, normal percussion bilaterally and non-labored Heart: RRR, S1,&S2 normal, no murmurM/R/G; nondisplaced PMI Abdomen: soft, non-tender; bowel sounds normal; no masses,  no organomegaly;  Extremities: extremities normal, atraumatic, no cyanosis or edema; fourth digit of right hand has a bandage and is quite sore Pulses: 2+ and symmetric Neurologic: Mental status: Alert, oriented, thought content appropriate Cranial nerves: normal (II-XII grossly intact)   Adult ECG Report  Rate: 59 ;  Rhythm: sinus bradycardia ; borderline low voltage. Normal axis, intervals and durations.  Narrative Interpretation: Otherwise normal/stable EKG  Recent Labs:  From PCP as of January 2016  CBC: W 6.8, H/H 11.9/35.5, P 301  Chemistries: Sodium 37, potassium 4.6, chloride 102, bicarbonate 25, glucose 89, BUN/creatinine 15/0.5, T bili 0.5, alkaline phosphatase 55, AST/ALT 17/12, T protein 6.4, albumin 3.7, calcium 9.9.  Lipid panel: TC 217, TG 165, HDL 82, LDL 102  ASSESSMENT / PLAN: Very stable with no recurrent significant symptoms of SVT. Tolerating beta blocker -->  OK to drop AM dose to 25 mg with additional 25 mg PRN.  She had the panel checked by PCP. Had a very high HDL levels. Would consider checking advanced lipid panel/NMR with next check.   Paroxysmal SVT (supraventricular tachycardia) No recurrent episodes. History of AVNRT, well controlled on current dose of beta blocker. Reviewed vagal maneuvers. As she is not having significant symptoms now, we can see about potentially reducing morning dose of metoprolol to 25 mg (one half tablet) with when necessary use of the other one half tablet.  Borderline hypertension Blood pressure looks very well controlled on current dose of beta blocker. Should be  okay to reduce.    Orders Placed This Encounter  Procedures  . EKG 12-Lead   No orders of the defined types were placed in this encounter.    May decrease morning dose of metoprolol to 25 mg  And take afternoon dose as directed  may use 25 mg as needed.  HAVE PRMARY ORDERED - CARDIO IQ - MORE EXTENSIVE LIPID PANEL.  Your physician wants you to follow-up in 12 months DR Collyn Ribas.   [name], M.D., M.S. Interventional Cardiologist   Pager # [phone]

## 2015-08-12 NOTE — Patient Instructions (Signed)
May decrease morning dose of metoprolol to 25 mg  And take afternoon dose as directed  may use 25 mg as needed.  HAVE PRMARY ORDERED - CARDIO IQ - MORE EXTENSIVE LIPID PANEL.  Your physician wants you to follow-up in 12 months DR HARDING.  You will receive a reminder letter in the mail two months in advance. If you don't receive a letter, please call our office to schedule the follow-up appointment.

## 2015-08-13 ENCOUNTER — Encounter: Payer: Self-pay | Admitting: Cardiology

## 2015-08-13 NOTE — Assessment & Plan Note (Signed)
No recurrent episodes. History of AVNRT, well controlled on current dose of beta blocker. Reviewed vagal maneuvers. As she is not having significant symptoms now, we can see about potentially reducing morning dose of metoprolol to 25 mg (one half tablet) with when necessary use of the other one half tablet.

## 2015-08-13 NOTE — Assessment & Plan Note (Signed)
Blood pressure looks very well controlled on current dose of beta blocker. Should be okay to reduce.

## 2015-08-20 ENCOUNTER — Encounter: Payer: Self-pay | Admitting: Cardiology

## 2015-09-12 DIAGNOSIS — M66241 Spontaneous rupture of extensor tendons, right hand: Secondary | ICD-10-CM | POA: Insufficient documentation

## 2015-09-23 ENCOUNTER — Other Ambulatory Visit: Payer: Self-pay | Admitting: Cardiology

## 2015-09-23 NOTE — Telephone Encounter (Signed)
Rx(s) sent to pharmacy electronically.  

## 2016-06-10 ENCOUNTER — Ambulatory Visit (INDEPENDENT_AMBULATORY_CARE_PROVIDER_SITE_OTHER): Payer: Medicare Other | Admitting: Podiatry

## 2016-06-10 ENCOUNTER — Ambulatory Visit (INDEPENDENT_AMBULATORY_CARE_PROVIDER_SITE_OTHER): Payer: Medicare Other

## 2016-06-10 DIAGNOSIS — M2040 Other hammer toe(s) (acquired), unspecified foot: Secondary | ICD-10-CM

## 2016-06-10 DIAGNOSIS — M21619 Bunion of unspecified foot: Secondary | ICD-10-CM

## 2016-06-10 NOTE — Patient Instructions (Signed)
Pre-Operative Instructions  Congratulations, you have decided to take an important step to improving your quality of life.  You can be assured that the doctors of [facility] will be with you every step of the way.  1. Plan to be at the surgery center/hospital at least 1 (one) hour prior to your scheduled time unless otherwise directed by the surgical center/hospital staff.  You must have a responsible adult accompany you, remain during the surgery and drive you home.  Make sure you have directions to the surgical center/hospital and know how to get there on time. 2. For hospital based surgery you will need to obtain a history and physical form from your family physician within 1 month prior to the date of surgery- we will give you a form for you primary physician.  3. We make every effort to accommodate the date you request for surgery.  There are however, times where surgery dates or times have to be moved.  We will contact you as soon as possible if a change in schedule is required.   4. No Aspirin/Ibuprofen for one week before surgery.  If you are on aspirin, any non-steroidal anti-inflammatory medications (Mobic, Aleve, Ibuprofen) you should stop taking it 7 days prior to your surgery.  You make take Tylenol  For pain prior to surgery.  5. Medications- If you are taking daily heart and blood pressure medications, seizure, reflux, allergy, asthma, anxiety, pain or diabetes medications, make sure the surgery center/hospital is aware before the day of surgery so they may notify you which medications to take or avoid the day of surgery. 6. No food or drink after midnight the night before surgery unless directed otherwise by surgical center/hospital staff. 7. No alcoholic beverages 24 hours prior to surgery.  No smoking 24 hours prior to or 24 hours after surgery. 8. Wear loose pants or shorts- loose enough to fit over bandages, boots, and casts. 9. No slip on shoes, sneakers are best. 10. Bring  your boot with you to the surgery center/hospital.  Also bring crutches or a walker if your physician has prescribed it for you.  If you do not have this equipment, it will be provided for you after surgery. 11. If you have not been contracted by the surgery center/hospital by the day before your surgery, call to confirm the date and time of your surgery. 12. Leave-time from work may vary depending on the type of surgery you have.  Appropriate arrangements should be made prior to surgery with your employer. 13. Prescriptions will be provided immediately following surgery by your doctor.  Have these filled as soon as possible after surgery and take the medication as directed. 14. Remove nail polish on the operative foot. 15. Wash the night before surgery.  The night before surgery wash the foot and leg well with the antibacterial soap provided and water paying special attention to beneath the toenails and in between the toes.  Rinse thoroughly with water and dry well with a towel.  Perform this wash unless told not to do so by your physician.  Enclosed: 1 Ice pack (please put in freezer the night before surgery)   1 Hibiclens skin cleaner   Pre-op Instructions  If you have any questions regarding the instructions, do not hesitate to call our office.  [place]: [address] [place], [place] [address] [phone]  [place]: [address] [place], [place] [address] [phone]  [place]: [address]  [place], [place] [address] [phone]   Dr.   Norman Regal DPM, Dr. Matthew Wagoner DPM, Dr. M. Todd Hyatt DPM, Dr. Titorya Stover DPM 

## 2016-06-10 NOTE — Progress Notes (Signed)
Subjective:     Patient ID: Morgan [name], female   DOB: July 12, 1951, 65 y.o.   MRN: [id number]  HPI patient presents stating I had surgery years ago on my right bunion and I'll need the left one fixed and also in a lot of problems with the second toe and the big toe third toe of the right foot and prominent pins from my previous surgery. States it's getting worse over time and that she's tried wider shoes and she's tried other modalities without relief of symptoms   Review of Systems  All other systems reviewed and are negative.      Objective:   Physical Exam  Constitutional: She is oriented to person, place, and time.  Cardiovascular: Intact distal pulses.   Musculoskeletal: Normal range of motion.  Neurological: She is oriented to person, place, and time.  Skin: Skin is warm.  Nursing note and vitals reviewed.  neurovascular status found to be intact with muscle strength adequate range of motion within normal limits with patient found to have structural bunion deformity left with healed surgical site right with prominent pin positions proximal and deviation of the right hallux against the second toe with elevation second toe and deviation of the third toe with contracture at the proximal interphalangeal joint. 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 We discussed correction and she wants to go ahead and have surgical intervention and wants to do the right foot first and I reviewed correction going over with her all possible complications as outlined in the consent form. Reviewed alternative treatments complications and what we'll be required and she wants surgery and  the right foot we'll be done first. I recommended Akin osteotomy along with digital fusion digit 3 right and release of the MPJ right second digit along with removal of 2 pins from the right first metatarsal. We reviewed at great length the surgeries and the fact there is no long-term guarantees as far as correction and that recovery can take upwards of 6 months. Patient wants surgery signs consent form is given all preoperative instructions and had air fracture walker dispensed with instructions and is encouraged to call with any questions prior to procedure  Ray report indicates there is significant structural malalignment of the first metatarsal left with increase of the intermetatarsal angle of approximate 16 with significant structural malalignment of the big toe right pressing against the second toe with elevation second toe and disruption of the proximal interphalangeal joint third toe with abnormal pin positions first metatarsal right

## 2016-06-12 ENCOUNTER — Telehealth: Payer: Self-pay | Admitting: *Deleted

## 2016-06-12 NOTE — Telephone Encounter (Signed)
I attempted to call patient.  I need to reschedule her surgery to 06/30/2016 per Dr. Paulla Dolly.  There are too many cases scheduled for 06/22/2016.

## 2016-06-12 NOTE — Telephone Encounter (Signed)
"  I got your message so I'm returning your call."  Yes, unfortunately we're going to have to reschedule your surgery to the following week, September 5.  Dr. Paulla Dolly said there's too many surgeries scheduled for that day.  "Are you sure he can't do it that day?  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  Now I will have to start all over again."  No, he can't do it that day.  He has a full morning schedule.  "I know when I was there the girl that scheduled me said he had a full schedule.  You'll call and let the nurse at the surgical know, correct?"  Yes, that is correct I will call them and reschedule surgery.  Thank you for understanding.

## 2016-06-23 ENCOUNTER — Telehealth: Payer: Self-pay | Admitting: *Deleted

## 2016-06-23 NOTE — Telephone Encounter (Addendum)
Pt states she has questions about 06/30/2016 surgery with Dr. Paulla Dolly. 07/17/2016-Pt states stitches feel tight can she put vaseline on them.  I told pt no, but could pt lotion on the foot about 1 1/2 inches from the suture line to make the areas surrounding skin more supple. Pt states understanding.

## 2016-06-24 NOTE — Telephone Encounter (Signed)
"  Do I need to stop taking my Progesterone, vitamin D and vitamin E prior to surgery.  I read somewhere that the progesterone can cause blood clotting if taken before surgery.  Will I be able to walk in he boot after surgery because I had promised my son that I would do something for him 3 weeks after surgery?  Will I be able to drive in the shoe after I transition from the boot?  I will ask Dr. Paulla Dolly about the medications.  You will be able to walk in the boot immediately after surgery.  You can drive in the surgical shoe but not the boot.

## 2016-06-30 ENCOUNTER — Encounter: Payer: Self-pay | Admitting: Podiatry

## 2016-06-30 ENCOUNTER — Telehealth: Payer: Self-pay

## 2016-06-30 ENCOUNTER — Other Ambulatory Visit: Payer: Self-pay

## 2016-06-30 DIAGNOSIS — M2041 Other hammer toe(s) (acquired), right foot: Secondary | ICD-10-CM | POA: Diagnosis not present

## 2016-06-30 DIAGNOSIS — Z4889 Encounter for other specified surgical aftercare: Secondary | ICD-10-CM | POA: Diagnosis not present

## 2016-06-30 MED ORDER — CEPHALEXIN 500 MG PO CAPS: 500.0000 mg | ORAL_CAPSULE | Freq: Two times a day (BID) | ORAL | 0 refills | Status: DC

## 2016-06-30 NOTE — Telephone Encounter (Signed)
Spoke with patient regarding post op prescription. She stated that she thought she was to be put on an abt after her surgery but was not given a Rx at the surgical center. Per Dr Paulla Dolly, keflex 500mg  BID #20, called in to [facility]

## 2016-07-03 ENCOUNTER — Other Ambulatory Visit: Payer: Self-pay | Admitting: *Deleted

## 2016-07-03 ENCOUNTER — Telehealth: Payer: Self-pay | Admitting: *Deleted

## 2016-07-03 MED ORDER — OXYCODONE-ACETAMINOPHEN 10-325 MG PO TABS: 1.0000 | ORAL_TABLET | Freq: Four times a day (QID) | ORAL | 0 refills | Status: DC | PRN

## 2016-07-03 NOTE — Telephone Encounter (Signed)
Called patient back at 1:14 pm at [phone] (Home #). Pt had called regarding her surgery which was performed on Tuesday, June 30, 2016. Pt was in a lot of pain and did not know if they could take 2 pills of Oxycodone-acetaminophen (Percocet) 10-325. Dr. Paulla Dolly did say she could take 1-2 pills. He also stated, "she could take off her ace wrap and elevate her foot to help with pain". Pt did need a refill for pain medication. Dr. Paulla Dolly approved refill of medication, dispensed 30 pills with no refills. Left prescription at the front desk for patient to pick up. Pt stated they understood.

## 2016-07-07 NOTE — Progress Notes (Signed)
DOS 09.05.2017 Removal 2 Pins from top Right Foot; Aiken Osteotomy with Wire Right; Fusion with Pin 3rd Right; Release MPT 2nd Right

## 2016-07-09 ENCOUNTER — Ambulatory Visit (INDEPENDENT_AMBULATORY_CARE_PROVIDER_SITE_OTHER): Payer: Medicare Other | Admitting: Podiatry

## 2016-07-09 ENCOUNTER — Ambulatory Visit (INDEPENDENT_AMBULATORY_CARE_PROVIDER_SITE_OTHER): Payer: Medicare Other

## 2016-07-09 VITALS — Temp 97.6°F

## 2016-07-09 DIAGNOSIS — M21619 Bunion of unspecified foot: Secondary | ICD-10-CM | POA: Diagnosis not present

## 2016-07-09 DIAGNOSIS — M2040 Other hammer toe(s) (acquired), unspecified foot: Secondary | ICD-10-CM

## 2016-07-09 DIAGNOSIS — Z9889 Other specified postprocedural states: Secondary | ICD-10-CM

## 2016-07-09 NOTE — Progress Notes (Signed)
Subjective:     Patient ID: Morgan [name], female   DOB: [birth year], 65 y.o.   MRN: [id number]  HPI patient presents stating that she's very happy with the right foot and she also wants to get her left foot fixed and talk about   Review of Systems     Objective:   Physical Exam Neurovascular status intact negative Homans sign noted second and third digits and excellent alignment as is the big toe with wound edges well coapted and good position with structural bunion deformity left    Assessment:     Doing well post foot surgery right with good alignment noted with structural bunion deformity left    Plan:     X-rays reviewed for the right foot and reapplied sterile dressing keeping the toes in good position and discussed bunion correction left which is scheduled for 3 weeks and we'll have the patient back again in 2 weeks to review that when stitches removed right. Reappoint earlier if any issues should occur  X-rays indicate good positional component with digits and good alignment and wire pin in good alignment

## 2016-07-17 ENCOUNTER — Telehealth: Payer: Self-pay | Admitting: *Deleted

## 2016-07-17 NOTE — Telephone Encounter (Signed)
"  I was scheduled for a second surgery for 07/28/2016.  I am just checking on that I have not heard from the surgical center.  I just want to make sure I'm still on.  Just give me a ring back.  You can leave me a message if I don't answer."

## 2016-07-20 ENCOUNTER — Ambulatory Visit (INDEPENDENT_AMBULATORY_CARE_PROVIDER_SITE_OTHER): Payer: Medicare Other | Admitting: Podiatry

## 2016-07-20 ENCOUNTER — Ambulatory Visit (INDEPENDENT_AMBULATORY_CARE_PROVIDER_SITE_OTHER): Payer: Medicare Other

## 2016-07-20 ENCOUNTER — Telehealth: Payer: Self-pay | Admitting: *Deleted

## 2016-07-20 DIAGNOSIS — M2040 Other hammer toe(s) (acquired), unspecified foot: Secondary | ICD-10-CM | POA: Diagnosis not present

## 2016-07-20 DIAGNOSIS — M21619 Bunion of unspecified foot: Secondary | ICD-10-CM

## 2016-07-20 DIAGNOSIS — Z9889 Other specified postprocedural states: Secondary | ICD-10-CM

## 2016-07-20 NOTE — Patient Instructions (Signed)

## 2016-07-20 NOTE — Telephone Encounter (Signed)
Pt states she was told she could shower, but can she put weight on the surgery foot, she is wearing surgical sandal now. Dr. Paulla Dolly states pt can now begin standing full on the foot, but no walking around without a surgical shoe.

## 2016-07-21 NOTE — Progress Notes (Signed)
Subjective:     Patient ID: Morgan [name], female   DOB: Jul 03, 1951, 65 y.o.   MRN: [id number]  HPI patient states I'm doing very well with my right foot and states that the toe so far as staining good alignment but she scared that we'll move and also she wants her left bunion fixed   Review of Systems     Objective:   Physical Exam Neurovascular status intact negative Homans sign noted with well-healing surgical site right forefoot with good digital position second toe and slight elevation third toe right foot with structural bunion deformity left that red painful and she cannot wear shoe gear with. States she's tried wider shoes she's tried to soak it and other modalities    Assessment:     Doing well postoperatively right with some elevation of the third digit with pin in place with wound edges well coapted with patient noted to have structural bunion left    Plan:     Reviewed x-rays of the right foot and dispensed a above ankle BioSkin brace to hold the toes down properly and discussed surgery for the left foot reviewing Austin-type osteotomy going over the surgery and all possible complications associated with it. Patient wants surgery signed consent form understanding alternative treatments complications and that recovery can take at least 6 months. She is encouraged to call with any questions prior to procedure

## 2016-07-22 NOTE — Telephone Encounter (Signed)
I realize you were here yesterday.  Do you still have questions?  "I actually want to let Dr. Paulla Dolly know that I did not have a lot of pain so I had not being taking the pain medication.  When I got home from surgery I took the pain medication.  I threw up a couple of times.  I thought it was from the anesthesia.  I took a pain pill before I came in to see him to get my stitches out.  I come to realize it is the pain medicine that is making me sick.  I had on white pants yesterday and had drunk a blueberry and oatmeal smoothie.  I was sick as a dog from the pain medicine.  It was not a pretty sight.  So, I just wanted to let Dr. Paulla Dolly know because I have another surgery coming up on Tuesday.  I do not want anymore of that medicine.  I am hoping he can prescribe something else.  Also, I am having bunion on other foot done.  Will the pin stay in or will it have to be removed later?"  The pin will stay in.  It will only be removed if it bothers you.  I will let Dr. Paulla Dolly know about the medication.

## 2016-07-28 ENCOUNTER — Encounter: Payer: Self-pay | Admitting: Podiatry

## 2016-07-28 DIAGNOSIS — M2012 Hallux valgus (acquired), left foot: Secondary | ICD-10-CM | POA: Diagnosis not present

## 2016-08-03 NOTE — Progress Notes (Signed)
DOS 10.03.2017 Austin Bunionectomy (cutting/moving bone) with Pin Fixation Left Foot

## 2016-08-07 ENCOUNTER — Ambulatory Visit (INDEPENDENT_AMBULATORY_CARE_PROVIDER_SITE_OTHER): Payer: Medicare Other | Admitting: Podiatry

## 2016-08-07 ENCOUNTER — Ambulatory Visit (INDEPENDENT_AMBULATORY_CARE_PROVIDER_SITE_OTHER): Payer: Medicare Other

## 2016-08-07 ENCOUNTER — Encounter: Payer: Self-pay | Admitting: Podiatry

## 2016-08-07 VITALS — Temp 99.7°F

## 2016-08-07 DIAGNOSIS — Z9889 Other specified postprocedural states: Secondary | ICD-10-CM

## 2016-08-07 DIAGNOSIS — M2040 Other hammer toe(s) (acquired), unspecified foot: Secondary | ICD-10-CM | POA: Diagnosis not present

## 2016-08-07 NOTE — Progress Notes (Signed)
This patient presents the office 1 week postoperatively for a biplanar Austin bunionectomy , left foot. He presents the office saying that she is doing well with a bandage as well as with the Cam Walker. She does admit that there was some bleeding and the bandage and there was some throbbing at the surgical site. She feels she is doing well following that, left foot surgery. She also presents the office saying that she expects to have the K wire removed from the third toe of the right foot   Neurovascular status intact. Negative Homans sign. Good wound coaptation noted over the first metatarsal left foot swelling is noted at the dorsomedial aspect first MPJ of the left foot. Patient has range of motion with no limitation due to the K wire examination of the right foot does reveal healing noted at the first MPJ and the K wire is present in third toe of the right foot  Postoperative surgical evaluation   Reviewed x-rays of the left foot and it revealed good bony alignment with 2 K wires intact in the first metatarsal head. X-rays were discussed with the patient and her left foot was examined. A bandage was reapplied to the left foot and she was told to continue to ambulate with her cam walker for 1 week. She is to return the office at that time to see Dr. Paulla Dolly. Her K wire in her third toe of the right foot was also removed at this time. Bandage was applied to the K wire site. She was told that she could return to sneakers to tolerance   Gardiner Barefoot DPM

## 2016-08-11 ENCOUNTER — Ambulatory Visit (INDEPENDENT_AMBULATORY_CARE_PROVIDER_SITE_OTHER): Payer: Medicare Other | Admitting: Cardiology

## 2016-08-11 ENCOUNTER — Encounter: Payer: Self-pay | Admitting: Cardiology

## 2016-08-11 ENCOUNTER — Ambulatory Visit: Payer: BC Managed Care – PPO | Admitting: Cardiology

## 2016-08-11 VITALS — BP 124/78 | HR 65 | Ht 62.0 in | Wt 115.0 lb

## 2016-08-11 DIAGNOSIS — I471 Supraventricular tachycardia: Secondary | ICD-10-CM

## 2016-08-11 DIAGNOSIS — R030 Elevated blood-pressure reading, without diagnosis of hypertension: Secondary | ICD-10-CM | POA: Diagnosis not present

## 2016-08-11 NOTE — Patient Instructions (Signed)
DISCUSS VAGAL MANEUVERS  FOR SVT ( FAST HEART RATE)   MAY USE  EXTRA 1/2 TABLET OF METOPROLOL (25 MG ) IF NEED FOR "SPELLS"   OKAY TO STAY WITH 1/2 TABLET OF METOPROLOL (25 MG ) IN MORNING AND 50 MG IN THE EVENING  Your physician wants you to follow-up in: [place] DR HARDING.You will receive a reminder letter in the mail two months in advance. If you don't receive a letter, please call our office to schedule the follow-up appointment.   If you need a refill on your cardiac medications before your next appointment, please call your pharmacy.

## 2016-08-11 NOTE — Progress Notes (Signed)
PCP: Asencion Noble, MD  Clinic Note: Chief Complaint  Patient presents with  . Follow-up    1 YEAR -- h/o SVT  . Palpitations   HPI: Morgan [name] is a 65 y.o. female with a PMH below who presents today for annual followup of SVT and PACs.   I last saw her in October of 2016, and she was doing relatively well on the increased dose of metoprolol. -- reduced to 1.5 tabs  Past Medical History:  Diagnosis Date  . Basal cell carcinoma   . Borderline hypertension   . H/O echocardiogram 2011   Normal ejection fraction greater than 50%. No wall motion abnormalities no valvular lesions.  . Hashimoto's thyroiditis   . 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  Since I last saw her she is had bilateral foot surgery currently has her left foot and ankle in a brace. As result she's not really minimal to exercise very much and is feeling a little bit more fatigued because of it.  She still has some palpitations off and on that are more noticeable now that she is reduced her beta blocker dose to 25 mg the morning and 50 at night. She doesn't notice that they're not as frequent when she is active. The palpitations bother her little bit, but she doesn't have any syncope or near syncopal type symptoms with it.   Cardiovascular Review of Symptoms:no chest pain or dyspnea on exertion positive for - palpitations and shortness of breath negative for - edema, irregular heartbeat, loss of consciousness, orthopnea, paroxysmal nocturnal dyspnea, shortness of breath or TIA/amaurosis fugax, near syncope/syncope   ROS: A comprehensive was performed. Review of Systems  Constitutional: Positive for weight loss (Despite lack of activity, she had lost some weight but has gained some back.). Negative for chills, fever and malaise/fatigue.  HENT: Negative  for congestion and sore throat.   Eyes: Negative for blurred vision, double vision and redness.  Respiratory: Negative for cough, sputum production, shortness of breath and wheezing.   Cardiovascular: Positive for palpitations. Negative for chest pain.       Per history of present illness  Gastrointestinal: Negative for blood in stool, heartburn and melena.       Occasional indigestion  Genitourinary: Negative for frequency and hematuria.  Musculoskeletal: Negative for falls and myalgias. Joint pain: Both feet/ankles. The left ankle and foot is currently in hard brace.  Neurological: Negative for tremors, sensory change, speech change, focal weakness, seizures, loss of consciousness and headaches.  Endo/Heme/Allergies: Does not bruise/bleed easily.  Psychiatric/Behavioral: Negative for depression. The patient is not nervous/anxious.   All other systems reviewed and are negative.   Prior to Admission medications   Medication Sig Start Date End Date Taking? Authorizing Provider  calcium carbonate (OS-CAL) 600 MG TABS tablet Take 600 mg by mouth daily.   Yes Historical Provider, MD  cholecalciferol (VITAMIN D) 1000 UNITS tablet Take 1,000 Units by mouth daily.   Yes Historical Provider, MD  estradiol (ESTRACE) 1 MG tablet Take 1 tablet by mouth daily. 06/21/13  Yes Historical Provider, MD  fish oil-omega-3 fatty acids 1000 MG capsule Take 1 g by mouth daily.   Yes Historical Provider, MD  meperidine (DEMEROL) 50 MG tablet Take 50 mg by mouth every 4 (four) hours as needed for severe pain.   Yes Wallene Huh, DPM  metoprolol (LOPRESSOR) 50 MG tablet take 1 tablet by mouth twice a day Patient taking differently: take 1 AND 1/2 (37.5) tablet by mouth twice a day 09/23/15  Yes [name], MD  Multiple Vitamin (MULTIVITAMIN) tablet Take 1 tablet by mouth daily.   Yes Historical Provider, MD  promethazine (PHENERGAN) 25 MG tablet Take 25 mg by mouth every 4 (four) hours as needed for nausea or  vomiting.   Yes Wallene Huh, DPM  vitamin E 400 UNIT capsule Take 400 Units by mouth daily.   Yes Historical Provider, MD     Allergies  Allergen Reactions  . Fish Allergy    Social History  Substance Use Topics  . Smoking status: Never Smoker  . Smokeless tobacco: Never Used  . Alcohol use Yes     Comment: Socially   Family History  Problem Relation Age of Onset  . Alzheimer's disease Father   . Parkinson's disease Maternal Grandmother   . Heart Problems Paternal Grandmother     Wt Readings from Last 3 Encounters:  08/11/16 52.2 kg (115 lb)  08/12/15 55.8 kg (123 lb)  07/30/14 54.7 kg (120 lb 9.6 oz)    PHYSICAL EXAM BP 124/78   Pulse 65   Ht 5\' 2"  (1.575 m)   Wt 52.2 kg (115 lb)   BMI 21.03 kg/m  General appearance: alert, cooperative, appears stated age, no distress and healthy appearing. HEENT: [place]/AT, EOMI, MMM, anicteric sclera Neck: no adenopathy, no carotid bruit and no JVD Lungs:CTAB, normal percussion bilaterally and non-labored Heart: RRR, S1,&S2 normal, no murmurM/R/G; nondisplaced PMI - occasional ectopy noted on exam Abdomen: soft, non-tender; bowel sounds normal; no masses,  no organomegaly;  Extremities: extremities normal, atraumatic, no cyanosis or edema; left ankle/foot in hard brace Pulses: 2+ and symmetric Neurologic: Mental status: Alert, oriented, thought content appropriate; Grossly normal   Adult ECG Report  Rate: 65;  Rhythm: normal sinus rhythm and premature ventricular contractions (PVC) ; borderline low voltage. Normal axis, intervals and durations.  Narrative Interpretation: Otherwise normal/stable EKG   Recent Labs: No labs available.  ASSESSMENT / PLAN: Problem List Items Addressed This Visit    Paroxysmal SVT (supraventricular tachycardia) (HCC) - Primary (Chronic)    I'm not sure if the episodes of palpitations she is noting a true SVTs. She is noticing some symptoms however with a lower dose of beta blocker. I told her that  if she hasn't regular episode that she can use the additional 25 mg as a when necessary. If she has to do this more frequent than not, she should just go back to 50 twice a day.  We also discussed vagal maneuvers. She was not aware of ice cold water and ice packs on the chest is another option.      Relevant Orders   EKG 12-Lead   Borderline hypertension (Chronic)    Stable on current beta blocker dose.      Relevant Orders   EKG 12-Lead    Other Visit Diagnoses   None.      Orders Placed This Encounter  Procedures  . EKG 12-Lead    Order Specific Question:   Where should this test be performed    Answer:   Other   No orders of the defined types were placed in this encounter.  Patient Instructions  DISCUSS VAGAL MANEUVERS  FOR SVT ( FAST HEART RATE)   MAY USE  EXTRA 1/2 TABLET OF METOPROLOL (25 MG ) IF NEED FOR "SPELLS"  OKAY TO STAY WITH 1/2 TABLET OF METOPROLOL (25 MG ) IN MORNING AND 50 MG IN THE EVENING  Your physician wants you to follow-up in: [place] DR Ayrabella [place].You will receive a reminder letter in the mail two months in advance. If you don't receive a letter, please call our office to schedule the follow-up appointment.   If you need a refill on your cardiac medications before your next appointment, please call your pharmacy.      [name], M.D., M.S. Interventional Cardiologist   Pager # [phone]

## 2016-08-13 ENCOUNTER — Encounter: Payer: Self-pay | Admitting: Cardiology

## 2016-08-13 ENCOUNTER — Ambulatory Visit (INDEPENDENT_AMBULATORY_CARE_PROVIDER_SITE_OTHER): Payer: Medicare Other | Admitting: Podiatry

## 2016-08-13 ENCOUNTER — Ambulatory Visit (INDEPENDENT_AMBULATORY_CARE_PROVIDER_SITE_OTHER): Payer: Medicare Other

## 2016-08-13 ENCOUNTER — Encounter: Payer: Self-pay | Admitting: Podiatry

## 2016-08-13 VITALS — BP 119/61 | HR 60 | Resp 16

## 2016-08-13 DIAGNOSIS — Z9889 Other specified postprocedural states: Secondary | ICD-10-CM

## 2016-08-13 DIAGNOSIS — M2040 Other hammer toe(s) (acquired), unspecified foot: Secondary | ICD-10-CM

## 2016-08-13 NOTE — Assessment & Plan Note (Signed)
Stable on current beta blocker dose.

## 2016-08-13 NOTE — Progress Notes (Signed)
Subjective:     Patient ID: Morgan [name], female   DOB: Apr 15, 1951, 65 y.o.   MRN: [id number]  HPI patient states I'm doing well with my foot but am concerned because I'm still having swelling in my right foot seems to be doing well   Review of Systems     Objective:   Physical Exam Neurovascular status intact negative Homans sign noted with well-healing surgical site left first metatarsal with wound edges well coapted and mild deviation against the second toe but within normal limits    Assessment:     Doing well structural correction left with good healing of structural correction right    Plan:     H&P condition reviewed and recommended anti-inflammatories physical therapy and gradual return to soft shoes left with explanation of continued compression elevation immobilization. Reappoint to recheck 4 weeks for x-ray of both feet  X-ray report indicates osteotomy left it's healing well with pins in place and good alignment noted

## 2016-08-13 NOTE — Assessment & Plan Note (Signed)
I'm not sure if the episodes of palpitations she is noting a true SVTs. She is noticing some symptoms however with a lower dose of beta blocker. I told her that if she hasn't regular episode that she can use the additional 25 mg as a when necessary. If she has to do this more frequent than not, she should just go back to 50 twice a day.  We also discussed vagal maneuvers. She was not aware of ice cold water and ice packs on the chest is another option.

## 2016-09-10 ENCOUNTER — Ambulatory Visit (INDEPENDENT_AMBULATORY_CARE_PROVIDER_SITE_OTHER): Payer: Medicare Other

## 2016-09-10 ENCOUNTER — Ambulatory Visit (INDEPENDENT_AMBULATORY_CARE_PROVIDER_SITE_OTHER): Payer: Medicare Other | Admitting: Podiatry

## 2016-09-10 DIAGNOSIS — Z9889 Other specified postprocedural states: Secondary | ICD-10-CM | POA: Diagnosis not present

## 2016-09-10 DIAGNOSIS — M21619 Bunion of unspecified foot: Secondary | ICD-10-CM | POA: Diagnosis not present

## 2016-09-10 DIAGNOSIS — M2040 Other hammer toe(s) (acquired), unspecified foot: Secondary | ICD-10-CM

## 2016-09-10 NOTE — Progress Notes (Signed)
Subjective:     Patient ID: Morgan [name], female   DOB: August 23, 1951, 65 y.o.   MRN: [id number]  HPI patient states she's doing real well with surgery with only mild swelling and she is able to gradually wear normal shoes and is ready to start working out   Review of Systems     Objective:   Physical Exam Neurovascular status intact with well coapted incision site bilateral with second toe right and excellent alignment and first MPJ left and excellent alignment with all other incision sites healing well and structure looks good    Assessment:     Doing well post surgery bilateral    Plan:     X-rays reviewed and allow patient now to gradually increase activity levels explaining swelling is still and normal process. Reappoint 8 weeks or earlier if needed  X-ray report indicates the osteotomy is healing excellent with good alignment noted good structural position wires and pins in place

## 2016-09-19 ENCOUNTER — Other Ambulatory Visit: Payer: Self-pay | Admitting: Cardiology

## 2016-09-21 NOTE — Telephone Encounter (Signed)
Rx has been sent to the pharmacy electronically. ° °

## 2016-11-05 ENCOUNTER — Ambulatory Visit (INDEPENDENT_AMBULATORY_CARE_PROVIDER_SITE_OTHER): Payer: Medicare Other | Admitting: Podiatry

## 2016-11-05 ENCOUNTER — Ambulatory Visit (INDEPENDENT_AMBULATORY_CARE_PROVIDER_SITE_OTHER): Payer: Medicare Other

## 2016-11-05 DIAGNOSIS — M21619 Bunion of unspecified foot: Secondary | ICD-10-CM

## 2016-11-05 DIAGNOSIS — M2040 Other hammer toe(s) (acquired), unspecified foot: Secondary | ICD-10-CM

## 2016-11-05 DIAGNOSIS — Z9889 Other specified postprocedural states: Secondary | ICD-10-CM

## 2016-11-08 NOTE — Progress Notes (Signed)
Subjective:     Patient ID: Morgan [name], female   DOB: 12-22-1950, 66 y.o.   MRN: [id number]  HPI patient states my left foot is doing great with minimal discomfort in my right foot is also doing well with digit good alignment   Review of Systems     Objective:   Physical Exam Neurovascular status intact negative Homans sign noted with good alignment of the first MPJ left hallux right and second digit with the toe stain adequately down second toe right foot.    Assessment:     Doing well postoperative surgery of both feet    Plan:     Reviewed conditions and at this point I've recommended return to normal shoes with gradual increase in activity and should be hopefully back to normal within the next several months. Patient will be seen back on an as-needed basis and is encouraged to call with questions  X-ray report indicates that osteotomy is healing well with good alignment noted and good positional component with fixation in place

## 2017-02-09 ENCOUNTER — Telehealth: Payer: Self-pay | Admitting: *Deleted

## 2017-02-09 NOTE — Telephone Encounter (Signed)
Pt states she had surgery back in there fall and now feels as if the pin is protruding out of the bone and would like to be checked. I informed pt that the pin on occasion does back out, and can be removed in the office surgical suited, she would be in a surgical sandal 3-7 days and then in a shoe that did not rub the surgery site until seen for 1st POV. Pt asked if removing the pin changed the integrity of the bone strength or positioning and I told her I had not heard that it did. Transferred pt to schedulers.

## 2017-02-15 ENCOUNTER — Ambulatory Visit: Payer: Medicare Other

## 2017-02-15 ENCOUNTER — Ambulatory Visit (INDEPENDENT_AMBULATORY_CARE_PROVIDER_SITE_OTHER): Payer: Medicare Other

## 2017-02-15 ENCOUNTER — Ambulatory Visit (INDEPENDENT_AMBULATORY_CARE_PROVIDER_SITE_OTHER): Payer: Medicare Other | Admitting: Podiatry

## 2017-02-15 ENCOUNTER — Encounter: Payer: Self-pay | Admitting: Podiatry

## 2017-02-15 DIAGNOSIS — M2012 Hallux valgus (acquired), left foot: Secondary | ICD-10-CM | POA: Diagnosis not present

## 2017-02-15 DIAGNOSIS — M2010 Hallux valgus (acquired), unspecified foot: Secondary | ICD-10-CM

## 2017-02-15 DIAGNOSIS — Z472 Encounter for removal of internal fixation device: Secondary | ICD-10-CM | POA: Diagnosis not present

## 2017-02-15 DIAGNOSIS — M2041 Other hammer toe(s) (acquired), right foot: Secondary | ICD-10-CM

## 2017-02-15 NOTE — Progress Notes (Signed)
Subjective:    Patient ID: Morgan [name], female   DOB: 66 y.o.   MRN: [id number]   HPI patient presents concerned about her left first metatarsal stating there is an area there that's becoming sore and making it hard to wear shoe gear    ROS      Objective:  Physical Exam Neurovascular status intact with prominent pin left first metatarsal in the proximal portion of the metatarsal shaft with digits overall good alignment with good healing occurring and good range of motion    Assessment:     Abnormal screw position of the first metatarsal left with prominent pin     Plan:    X-rays reviewed of both feet and today I discussed pin removal and allowed her to read consent form for pin removal. Explained procedure and risk and patient wants surgery and signs consent form after extensive review understanding alternative treatments complications and is scheduled for outpatient surgery. She will call us with any questions we can do this in the office

## 2017-02-25 ENCOUNTER — Telehealth: Payer: Self-pay | Admitting: *Deleted

## 2017-02-25 NOTE — Telephone Encounter (Signed)
Per Morgan [name], patient called asking if she could reschedule her office surgery.  She asked that I call her to reschedule.    I am returning your call.  You wanted to reschedule surgery?  "I do because I don't know what time I will be coming back from the beach.  What dates does he have available?"  He can do it on May 9 or 14.  "Let me think about it and I will call you back."

## 2017-02-26 NOTE — Telephone Encounter (Signed)
"  I have an office surgery scheduled for Monday, 03/01/2017 at 7:35 am.  I know I spoke to you yesterday.  I need to know will I have to keep this place dry?  Will I have to wear a surgery shoe?"  Yes, you will have to keep it dry for about 3 to 4 days then you can shower.  You may want to wear the surgical shoe to keep the incision from getting irritated due to rubbing against a shoe.  "I think it is advisable for me to reschedule to the following Monday if it's still available.  I have some school things I need to tie up and some other things as well."  Monday, 03/08/2017 is available.  I'll get it rescheduled to then.  "I need to be there at the same timel, I'm assuming?"  Yes, be here at 7:45 am.

## 2017-03-01 ENCOUNTER — Ambulatory Visit: Payer: Medicare Other

## 2017-03-08 ENCOUNTER — Ambulatory Visit (INDEPENDENT_AMBULATORY_CARE_PROVIDER_SITE_OTHER): Payer: Medicare Other | Admitting: Podiatry

## 2017-03-08 ENCOUNTER — Encounter: Payer: Self-pay | Admitting: Podiatry

## 2017-03-08 VITALS — BP 112/60 | HR 52 | Temp 97.1°F | Resp 16

## 2017-03-08 DIAGNOSIS — Z472 Encounter for removal of internal fixation device: Secondary | ICD-10-CM | POA: Diagnosis not present

## 2017-03-09 NOTE — Progress Notes (Signed)
Subjective:    Patient ID: Morgan [name], female   DOB: 66 y.o.   MRN: [id number]   HPI patient presents to office removal of pin left first metatarsal with pain noted upon palpation    ROS      Objective:  Physical Exam Neurovascular status intact negative Homans sign noted with prominence around the fifth metatarsal shaft left with pain with palpation    Assessment:    Abnormal position of pin left with pain     Plan:     Patient is brought to the OR and placed in the supine position where she was injected with 60 mg like Marcaine mixture. The patient's left foot was prepped and draped utilizing standard aseptic technique and the left foot was exsanguinated with Ace wrap and tourniquet inflated to 250 mmHg. Following procedure was performed tender dorsal aspect left first metatarsal shaft where an approximate 3 cm incision was made medial to the extensor tendon. The incision deepened through subcutaneous tissue down to capsule Her Incision Was Made. The Capsule Tissue Was Sharply Dissected off the Underlying Bone and I Noted Prominent Pin Position in This Area and Utilizing Sharp and Blunt Dissection the Pin Was Freed from [employer] and Removed In [place]. Wound Was Flushed, Some Solution and Sutured with 5-0 Nylon and Sterile Dressing Applied with Capillary Fill Noted Be Immediate to All Digits upon Release of Tourniquet

## 2017-03-09 NOTE — Progress Notes (Signed)
Office Surgery DOS 05.14.2018 Removal Pin 1st Metatarsal Left Foot.

## 2017-03-15 ENCOUNTER — Other Ambulatory Visit: Payer: Medicare Other

## 2017-03-18 ENCOUNTER — Ambulatory Visit (INDEPENDENT_AMBULATORY_CARE_PROVIDER_SITE_OTHER): Payer: Medicare Other

## 2017-03-18 ENCOUNTER — Ambulatory Visit (INDEPENDENT_AMBULATORY_CARE_PROVIDER_SITE_OTHER): Payer: Self-pay | Admitting: Podiatry

## 2017-03-18 DIAGNOSIS — Z472 Encounter for removal of internal fixation device: Secondary | ICD-10-CM

## 2017-03-18 DIAGNOSIS — M2012 Hallux valgus (acquired), left foot: Secondary | ICD-10-CM

## 2017-03-18 NOTE — Progress Notes (Signed)
Subjective:    Patient ID: Morgan [name], female   DOB: 66 y.o.   MRN: [id number]   HPI patient states I'm doing well    ROS      Objective:  Physical Exam  Neurovascular status intact with patient's left dorsal foot doing much better with wound edges well coapted and no breakdown of tissue    Assessment:    Doing well post pin removal left     Plan:     I removed stitches at the current time and applied sterile dressing and instructed on continued careful usage for the next several weeks

## 2017-09-20 ENCOUNTER — Ambulatory Visit: Payer: Medicare Other | Admitting: Cardiology

## 2017-09-20 ENCOUNTER — Encounter: Payer: Self-pay | Admitting: Cardiology

## 2017-09-20 VITALS — BP 122/66 | HR 62 | Ht 62.0 in | Wt 117.0 lb

## 2017-09-20 DIAGNOSIS — R030 Elevated blood-pressure reading, without diagnosis of hypertension: Secondary | ICD-10-CM | POA: Diagnosis not present

## 2017-09-20 DIAGNOSIS — I471 Supraventricular tachycardia: Secondary | ICD-10-CM

## 2017-09-20 DIAGNOSIS — R42 Dizziness and giddiness: Secondary | ICD-10-CM

## 2017-09-20 MED ORDER — METOPROLOL TARTRATE 25 MG PO TABS: 25.0000 mg | ORAL_TABLET | Freq: Two times a day (BID) | ORAL | 3 refills | Status: DC

## 2017-09-20 NOTE — Patient Instructions (Signed)
Medication Instructions:  DECREASE metoprolol to 25 mg (1/2 tablet) two times daily ---ok to take additional 25 mg for breakthrough palpitations  Follow-Up: Your physician wants you to follow-up in: 12 months with [name]. You will receive a reminder letter in the mail two months in advance. If you don't receive a letter, please call our office to schedule the follow-up appointment.   Any Other Special Instructions Will Be Listed Below (If Applicable).     If you need a refill on your cardiac medications before your next appointment, please call your pharmacy.

## 2017-09-20 NOTE — Progress Notes (Signed)
PCP: Asencion Noble, MD  Clinic Note: Chief Complaint  Patient presents with  . Follow-up    Dizziness  . Palpitations   HPI: Morgan [name] is a 65 y.o. female with a PMH below who presents for annual f/u of SVT & PACs    I last saw her in October of 2017, and she was doing relatively well on metoprolol. -- noticed more pronounced palpitations on reduced Beta Blocker dose - was taking 25 mg AM & 50 mg PM ith PRN 25 mg for breakthrough.  Past Medical History:  Diagnosis Date  . Basal cell carcinoma   . Borderline hypertension   . H/O echocardiogram 2011   Normal ejection fraction greater than 50%. No wall motion abnormalities no valvular lesions.  . Hashimoto's thyroiditis   . 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  Interval History: Morgan [name] presents today for the most part doing okay, but she has noted having some episodes of orthostatic dizziness, getting lightheaded with different changes in posture.  She really has not noticed any significant palpitations associated with these episodes, more orthostatic type symptoms.  She tells me that really she has not recognized any recurrence of any rapid irregular heartbeats or palpitations.  Besides having a positional dizziness, no syncope or near syncope.  No TIA or amaurosis fugax symptoms.  Cardiovascular Review of Symptoms:no chest pain or dyspnea on exertion positive for - Positional dizziness/lightheadedness negative for - edema, irregular heartbeat, loss of consciousness, murmur, orthopnea, palpitations, paroxysmal nocturnal dyspnea, rapid heart rate, shortness of breath or No melena, hematochezia, epistaxis or hematuria   ROS: A comprehensive was performed. Review of Systems  Constitutional: Positive for weight loss (Pretty much intentional). Negative for chills, fever and malaise/fatigue.  HENT: Negative for  congestion.   Eyes: Negative for blurred vision, double vision and redness.  Respiratory: Negative for cough, shortness of breath and wheezing.   Cardiovascular: Negative for chest pain and palpitations.       Per history of present illness  Gastrointestinal: Negative for heartburn.       Occasional indigestion  Genitourinary: Negative for dysuria and frequency.  Musculoskeletal: Negative for falls and myalgias.  Neurological: Positive for dizziness. Negative for focal weakness, loss of consciousness and headaches.  Endo/Heme/Allergies: Does not bruise/bleed easily.  Psychiatric/Behavioral: Negative for depression. The patient is not nervous/anxious.   All other systems reviewed and are negative.   Current Outpatient Medications on File Prior to Visit  Medication Sig Dispense Refill  . calcium carbonate (OS-CAL) 600 MG TABS tablet Take 600 mg by mouth daily.    . cholecalciferol (VITAMIN D) 1000 UNITS tablet Take 1,000 Units by mouth daily.    Marland Kitchen estradiol (ESTRACE) 1 MG tablet Take 1 tablet by mouth daily.    . fish oil-omega-3 fatty acids 1000 MG capsule Take 1 g by mouth daily.    . Multiple Vitamin (MULTIVITAMIN) tablet Take 1 tablet by mouth daily.    . progesterone (PROMETRIUM) 200 MG capsule   1  . vitamin E 400 UNIT capsule Take 400 Units by mouth daily.     No current facility-administered medications on file prior to visit.     Allergies  Allergen Reactions  . Fish Allergy    Social History   Tobacco Use  . Smoking status: Never Smoker  . Smokeless tobacco: Never Used  Substance Use Topics  . Alcohol use: Yes  Comment: Socially  . Drug use: Not on file   Family History  Problem Relation Age of Onset  . Alzheimer's disease Father   . Parkinson's disease Maternal Grandmother   . Heart Problems Paternal Grandmother     Wt Readings from Last 3 Encounters:  09/20/17 117 lb (53.1 kg)  08/11/16 115 lb (52.2 kg)  08/12/15 123 lb (55.8 kg)    PHYSICAL EXAM BP  122/66   Pulse 62   Ht 5\' 2"  (1.575 m)   Wt 117 lb (53.1 kg)   SpO2 97%   BMI 21.40 kg/m   Physical Exam  Constitutional: She is oriented to person, place, and time. She appears well-developed and well-nourished. No distress.  HENT:  Head: Normocephalic and atraumatic.  Eyes: EOM are normal. Pupils are equal, round, and reactive to light.  Neck: Normal range of motion. Neck supple. No hepatojugular reflux and no JVD present. Carotid bruit is not present.  Cardiovascular: Normal rate, regular rhythm and normal heart sounds. Exam reveals no gallop and no friction rub.  No murmur heard. Non-displaced PMI  Pulmonary/Chest: Effort normal and breath sounds normal. No respiratory distress. She has no wheezes. She has no rales.  Abdominal: Soft. Bowel sounds are normal. She exhibits no distension. There is no tenderness. There is no rebound.  Musculoskeletal: Normal range of motion. She exhibits no edema.  Neurological: She is alert and oriented to person, place, and time.  Skin: Skin is warm and dry. No erythema.  Psychiatric: She has a normal mood and affect. Her behavior is normal. Judgment and thought content normal.  Nursing note and vitals reviewed.   Adult ECG Report  Rate: 55;  Rhythm: sinus bradycardia ; borderline low voltage. Normal axis, intervals and durations.  Narrative Interpretation: Otherwise normal/stable EKG   Recent Labs: No labs available.  ASSESSMENT / PLAN: Problem List Items Addressed This Visit    Borderline hypertension (Chronic)    More than adequate controlled on current dose of beta-blocker.      Orthostatic dizziness    Plan for now is to cut evening dose of metoprolol.  Also discussed adequate hydration and verbalizing salt intake.      Paroxysmal SVT (supraventricular tachycardia) (HCC) - Primary (Chronic)    As far she can tell, no recurrent symptoms on current dose of medications.  Currently resting bradycardia with taking 25 mg metoprolol in the  morning and 50 in the evening.  Plan: Back off to 25 twice daily based on her having some orthostatic dizziness. -->  Still okay to use additional 25 mg as needed palpitations. Continue to monitor. Discussed vagal maneuvers.      Relevant Orders   EKG 12-Lead (Completed)       Orders Placed This Encounter  Procedures  . EKG 12-Lead   Meds ordered this encounter  Medications  . DISCONTD: metoprolol tartrate (LOPRESSOR) 25 MG tablet    Sig: Take 1 tablet (25 mg total) by mouth 2 (two) times daily.    Dispense:  180 tablet    Refill:  3   Patient Instructions  Medication Instructions:  DECREASE metoprolol to 25 mg (1/2 tablet) two times daily ---ok to take additional 25 mg for breakthrough palpitations  Follow-Up: Your physician wants you to follow-up in: 12 months with [name]. You will receive a reminder letter in the mail two months in advance. If you don't receive a letter, please call our office to schedule the follow-up appointment.   Any Other Special Instructions Will  Be Listed Below (If Applicable).     If you need a refill on your cardiac medications before your next appointment, please call your pharmacy.       [name], M.D., M.S. Interventional Cardiologist   Pager # [phone]

## 2017-09-21 ENCOUNTER — Other Ambulatory Visit: Payer: Self-pay | Admitting: Cardiology

## 2017-09-21 ENCOUNTER — Encounter: Payer: Self-pay | Admitting: Cardiology

## 2017-09-21 DIAGNOSIS — R42 Dizziness and giddiness: Secondary | ICD-10-CM | POA: Insufficient documentation

## 2017-09-21 NOTE — Assessment & Plan Note (Signed)
More than adequate controlled on current dose of beta-blocker.

## 2017-09-21 NOTE — Assessment & Plan Note (Signed)
Plan for now is to cut evening dose of metoprolol.  Also discussed adequate hydration and verbalizing salt intake.

## 2017-09-21 NOTE — Telephone Encounter (Signed)
REFILL 

## 2017-09-21 NOTE — Assessment & Plan Note (Addendum)
As far she can tell, no recurrent symptoms on current dose of medications.  Currently resting bradycardia with taking 25 mg metoprolol in the morning and 50 in the evening.  Plan: Back off to 25 twice daily based on her having some orthostatic dizziness. -->  Still okay to use additional 25 mg as needed palpitations. Continue to monitor. Discussed vagal maneuvers.

## 2018-04-12 ENCOUNTER — Other Ambulatory Visit: Payer: Self-pay

## 2018-04-12 ENCOUNTER — Encounter (HOSPITAL_COMMUNITY): Payer: Self-pay | Admitting: Emergency Medicine

## 2018-04-12 ENCOUNTER — Emergency Department (HOSPITAL_COMMUNITY)
Admission: EM | Admit: 2018-04-12 | Discharge: 2018-04-12 | Disposition: A | Discharge status: Home / Self Care | Payer: Medicare Other | Attending: Emergency Medicine | Admitting: Emergency Medicine

## 2018-04-12 ENCOUNTER — Emergency Department (HOSPITAL_COMMUNITY): Payer: Medicare Other

## 2018-04-12 DIAGNOSIS — R509 Fever, unspecified: Secondary | ICD-10-CM | POA: Diagnosis not present

## 2018-04-12 DIAGNOSIS — N12 Tubulo-interstitial nephritis, not specified as acute or chronic: Secondary | ICD-10-CM | POA: Insufficient documentation

## 2018-04-12 DIAGNOSIS — R1031 Right lower quadrant pain: Secondary | ICD-10-CM | POA: Diagnosis present

## 2018-04-12 DIAGNOSIS — I10 Essential (primary) hypertension: Secondary | ICD-10-CM | POA: Diagnosis not present

## 2018-04-12 HISTORY — DX: Malignant melanoma of skin, unspecified: C43.9

## 2018-04-12 LAB — COMPREHENSIVE METABOLIC PANEL
ALBUMIN: 3.5 g/dL (ref 3.5–5.0)
ALK PHOS: 72 U/L (ref 38–126)
ALT: 13 U/L — ABNORMAL LOW (ref 14–54)
AST: 17 U/L (ref 15–41)
Anion gap: 9 (ref 5–15)
BUN: 10 mg/dL (ref 6–20)
CALCIUM: 8.8 mg/dL — AB (ref 8.9–10.3)
CO2: 25 mmol/L (ref 22–32)
CREATININE: 0.96 mg/dL (ref 0.44–1.00)
Chloride: 98 mmol/L — ABNORMAL LOW (ref 101–111)
GFR calc Af Amer: >60 mL/min (ref >60)
GFR, EST NON AFRICAN AMERICAN: 60 mL/min — AB (ref >60)
GLUCOSE: 122 mg/dL — AB (ref 65–99)
Potassium: 3.5 mmol/L (ref 3.5–5.1)
Sodium: 132 mmol/L — ABNORMAL LOW (ref 135–145)
TOTAL PROTEIN: 7.3 g/dL (ref 6.5–8.1)
Total Bilirubin: 0.6 mg/dL (ref 0.3–1.2)

## 2018-04-12 LAB — CBC WITH DIFFERENTIAL/PLATELET
BASOS PCT: 0 %
Basophils Absolute: 0 10*3/uL (ref 0.0–0.1)
EOS ABS: 0.1 10*3/uL (ref 0.0–0.7)
EOS PCT: 1 %
HCT: 37 % (ref 36.0–46.0)
Hemoglobin: 12.3 g/dL (ref 12.0–15.0)
Lymphocytes Relative: 8 %
Lymphs Abs: 1.2 10*3/uL (ref 0.7–4.0)
MCH: 31.4 pg (ref 26.0–34.0)
MCHC: 33.2 g/dL (ref 30.0–36.0)
MCV: 94.4 fL (ref 78.0–100.0)
MONO ABS: 1.8 10*3/uL — AB (ref 0.1–1.0)
MONOS PCT: 12 %
Neutro Abs: 12.1 10*3/uL — ABNORMAL HIGH (ref 1.7–7.7)
Neutrophils Relative %: 79 %
PLATELETS: 272 10*3/uL (ref 150–400)
RBC: 3.92 MIL/uL (ref 3.87–5.11)
RDW: 12.3 % (ref 11.5–15.5)
WBC: 15.2 10*3/uL — ABNORMAL HIGH (ref 4.0–10.5)

## 2018-04-12 LAB — URINALYSIS, ROUTINE W REFLEX MICROSCOPIC
BILIRUBIN URINE: NEGATIVE
Glucose, UA: NEGATIVE mg/dL
Ketones, ur: NEGATIVE mg/dL
Leukocytes, UA: NEGATIVE
NITRITE: NEGATIVE
PH: 6 (ref 5.0–8.0)
Protein, ur: NEGATIVE mg/dL
SPECIFIC GRAVITY, URINE: 1.003 — AB (ref 1.005–1.030)

## 2018-04-12 LAB — LIPASE, BLOOD: Lipase: 25 U/L (ref 11–51)

## 2018-04-12 MED ORDER — IOPAMIDOL (ISOVUE-300) INJECTION 61%
100.0000 mL | Freq: Once | INTRAVENOUS | Status: AC | PRN
  Administered 2018-04-12: 100 mL via INTRAVENOUS

## 2018-04-12 MED ORDER — SULFAMETHOXAZOLE-TRIMETHOPRIM 800-160 MG PO TABS: 1.0000 | ORAL_TABLET | Freq: Two times a day (BID) | ORAL | 0 refills | Status: AC

## 2018-04-12 MED ORDER — IOPAMIDOL (ISOVUE-300) INJECTION 61%
30.0000 mL | Freq: Once | INTRAVENOUS | Status: AC | PRN
  Administered 2018-04-12: 30 mL via ORAL

## 2018-04-12 MED ORDER — SODIUM CHLORIDE 0.9 % IV SOLN
1.0000 g | Freq: Once | INTRAVENOUS | Status: AC
  Administered 2018-04-12: 1 g via INTRAVENOUS
  Filled 2018-04-12: qty 10

## 2018-04-12 MED ORDER — SODIUM CHLORIDE 0.9 % IV BOLUS
1000.0000 mL | Freq: Once | INTRAVENOUS | Status: AC
  Administered 2018-04-12: 1000 mL via INTRAVENOUS

## 2018-04-12 NOTE — ED Provider Notes (Signed)
Emergency Department Provider Note   I have reviewed the triage vital signs and the nursing notes.   HISTORY  Chief Complaint Abdominal Pain   HPI Morgan [name] is a 67 y.o. female with PMH of HTN, SVT, Melanoma, and Hashimoto's thyroiditis presents to the emergency department for evaluation of right lower quadrant abdominal pain with fever.  Patient states she has had intermittent fever and occasional right-sided discomfort over the past 3 to 4 weeks.  During her initial episode she was thought to have a urinary tract infection.  She states she keeps antibiotics to treat this at home and her symptoms seem to resolve.  During her second episode of fever a week or so later she developed some mild right-sided discomfort but nothing significant.  That resolved spontaneously until today when she developed fever of 103 and had more severe right lower quadrant abdominal pain.  She denies any vaginal bleeding or discharge.  No dysuria, hesitancy, urgency.  No chest pain or productive cough.  She has no prior abdominal surgical history.  She called her PCP and was referred directly to the emergency department for further evaluation. No radiation of symptoms. Pain worse with walking and movement.    Past Medical History:  Diagnosis Date  . Basal cell carcinoma   . Borderline hypertension   . H/O echocardiogram 2011   Normal ejection fraction greater than 50%. No wall motion abnormalities no valvular lesions.  . Hashimoto's thyroiditis   . Melanoma ([place])   . 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 Orthostatic dizziness 09/21/2017  . History of Hashimoto's thyroiditis   . Borderline hypertension   . Paroxysmal SVT (supraventricular tachycardia) ([place]) 04/25/2000    Past Surgical History:  Procedure Laterality Date  . BUNIONECTOMY  2006  . NECK SURGERY  2006   Cosmetic    Allergies Fish allergy  Family History  Problem Relation Age of Onset  . Alzheimer's disease Father   . Parkinson's disease Maternal Grandmother   . Heart Problems Paternal Grandmother     Social History Social History   Tobacco Use  . Smoking status: Never Smoker  . Smokeless tobacco: Never Used  Substance Use Topics  . Alcohol use: Yes    Comment: Socially  . Drug use: Never    Review of Systems  Constitutional: Positive fever/chills Eyes: No visual changes. ENT: No sore throat. Cardiovascular: Denies chest pain. Respiratory: Denies shortness of breath. Gastrointestinal: Positive RLQ abdominal pain. Positive nausea, no vomiting.  No diarrhea.  No constipation. Genitourinary: Negative for dysuria. Musculoskeletal: Negative for back pain. Skin: Negative for rash. Neurological: Negative for headaches, focal weakness or numbness.  10-point ROS otherwise negative.  ____________________________________________   PHYSICAL EXAM:  VITAL SIGNS: ED Triage Vitals  Enc Vitals Group     BP 04/12/18 1521 (!) 141/69     Pulse Rate 04/12/18 1521 (!) 117     Resp 04/12/18 1521 18     Temp 04/12/18 1521 99.4 F (37.4 C)     Temp Source 04/12/18 1521 Oral     SpO2 04/12/18 1521 100 %     Weight 04/12/18 1522 105 lb (47.6 kg)     Height 04/12/18 1522 5\' 3"  (1.6 m)     Pain Score 04/12/18 1522 0   Constitutional: Alert and oriented. Well appearing and in no acute distress. Eyes: Conjunctivae  are normal. Head: Atraumatic. Nose: No congestion/rhinnorhea. Mouth/Throat: Mucous membranes are moist.  Neck: No stridor. Cardiovascular: Tachcyardia. Good peripheral circulation. Grossly normal heart sounds.   Respiratory: Normal respiratory effort.  No retractions. Lungs CTAB. Gastrointestinal: Soft with mild RLQ tenderness. No rebound or guarding. No distention.  Musculoskeletal: No lower extremity tenderness nor edema. No gross deformities of  extremities. Neurologic:  Normal speech and language. No gross focal neurologic deficits are appreciated.  Skin:  Skin is warm, dry and intact. No rash noted.  ____________________________________________   LABS (all labs ordered are listed, but only abnormal results are displayed)  Labs Reviewed  COMPREHENSIVE METABOLIC PANEL - Abnormal; Notable for the following components:      Result Value   Sodium 132 (*)    Chloride 98 (*)    Glucose, Bld 122 (*)    Calcium 8.8 (*)    ALT 13 (*)    GFR calc non Af Amer 60 (*)    All other components within normal limits  CBC WITH DIFFERENTIAL/PLATELET - Abnormal; Notable for the following components:   WBC 15.2 (*)    Neutro Abs 12.1 (*)    Monocytes Absolute 1.8 (*)    All other components within normal limits  URINALYSIS, ROUTINE W REFLEX MICROSCOPIC - Abnormal; Notable for the following components:   APPearance HAZY (*)    Specific Gravity, Urine 1.003 (*)    Hgb urine dipstick LARGE (*)    Bacteria, UA MANY (*)    All other components within normal limits  URINE CULTURE  LIPASE, BLOOD   ____________________________________________  EKG   EKG Interpretation  Date/Time:  Tuesday April 12 2018 16:01:24 EDT Ventricular Rate:  93 PR Interval:    QRS Duration: 93 QT Interval:  356 QTC Calculation: 443 R Axis:   84 Text Interpretation:  Sinus rhythm Borderline right axis deviation Low voltage, precordial leads No STEMI.  Confirmed by Nanda Quinton [phone]) on 04/12/2018 4:10:24 PM       ____________________________________________  RADIOLOGY  Ct Abdomen Pelvis W Contrast  Result Date: 04/12/2018 CLINICAL DATA:  67 year old female with fever, loss of appetite, right lower abdominal pain for 3-4 weeks. EXAM: CT ABDOMEN AND PELVIS WITH CONTRAST TECHNIQUE: Multidetector CT imaging of the abdomen and pelvis was performed using the standard protocol following bolus administration of intravenous contrast. CONTRAST:  83mL ISOVUE-300  IOPAMIDOL (ISOVUE-300) INJECTION 61%, 134mL ISOVUE-300 IOPAMIDOL (ISOVUE-300) INJECTION 61% COMPARISON:  None. FINDINGS: Lower chest: Dependent lung base atelectasis. Superimposed mild linear scarring or atelectasis in the anterior basal segment of the right lower lobe. No pleural or pericardial effusion. Borderline to mild cardiac enlargement. Hepatobiliary: Negative liver and gallbladder. Pancreas: Negative. Spleen: Negative. Adrenals/Urinary Tract: Normal adrenal glands. Abnormal enhancement of the right kidney is heterogeneous with indistinct cortical areas of hypoenhancement (series 2, images 29 and 35. The medial hypoenhancement of the kidney is associated with medial perinephric space soft tissue stranding which is inseparable from the right psoas muscle on coronal image 53. Superimposed benign parapelvic and cortical right renal cysts. Delayed post contrast excretion from the right kidney is symmetric to that on the left. There is asymmetric right ureter urothelial enhancement (series 2, image 40). No hydroureter. The contralateral left kidney appears normal aside from benign parapelvic and cortical cysts. No urologic calculus identified. Unremarkable urinary bladder. Stomach/Bowel: Negative rectum and sigmoid. Oral contrast has reached the proximal sigmoid colon, and is mixed with stool in the descending colon. Contrast build and negative transverse and right colon aside from mild  redundancy. Negative cecum partially located in the right pelvis. Normal appendix on series 2, image 55. Negative terminal ileum. No dilated or abnormal small bowel. Decompressed stomach and duodenum. No abdominal free air, free fluid. Vascular/Lymphatic: The major arterial structures in the abdomen and pelvis are patent with minimal atherosclerosis. Portal venous system is patent. No lymphadenopathy. Reproductive: Retroverted uterus.  Negative ovaries. Other: No pelvic free fluid. Musculoskeletal: Levoconvex lumbar scoliosis with  occasional mild spondylolisthesis. Moderate to severe lumbar facet degeneration. No acute or suspicious osseous lesion identified. IMPRESSION: 1. Abnormal enhancement of the right kidney and proximal right ureter most compatible with Acute Pyelonephritis, Pyuria. Associated medial right perinephric space inflammation inseparable from the right psoas muscle. No abscess or other complicating features at this time. 2. The left kidney and urinary bladder appear within normal limits. 3. Normal appendix. Lung base atelectasis. Scoliosis and degenerative changes in the lumbar spine. Electronically Signed   By: Genevie Ann M.D.   On: 04/12/2018 18:43    ____________________________________________   PROCEDURES  Procedure(s) performed:   Procedures  None ____________________________________________   INITIAL IMPRESSION / ASSESSMENT AND PLAN / ED COURSE  Pertinent labs & imaging results that were available during my care of the patient were reviewed by me and considered in my medical decision making (see chart for details).  Patient presents to the emergency department with fever and right lower quadrant abdominal pain which returned today but she has experienced these symptoms intermittently over the past 3 to 4 weeks.  She was referred here by her PCP.  She has tachycardia on arrival but is afebrile.  She is not experiencing significant pain when sitting still but has some discomfort with movement.  He was active, working out, playing golf 2 days ago without difficulty.  Plan for labs, UA, CT abdomen pelvis to evaluate for acute appendicitis.  06:50 PM CT shows enhancement of the right kidney consistent with acute pyelonephritis.  The patient's initial antibiotic was Macrobid.  Plan to cover with Rocephin here.  Patient's additional labs reviewed with no acute findings.  She does have mild leukocytosis.  Vital signs are normal.  She is well-appearing.  Believe she would be a good candidate for oral  antibiotics and home treatment of acute pyelonephritis. Patient treated with Rocephin in the ED prior to discharge. Tolerating PO without difficulty.   At this time, I do not feel there is any life-threatening condition present. I have reviewed and discussed all results (EKG, imaging, lab, urine as appropriate), exam findings with patient. I have reviewed nursing notes and appropriate previous records.  I feel the patient is safe to be discharged home without further emergent workup. Discussed usual and customary return precautions. Patient and family (if present) verbalize understanding and are comfortable with this plan.  Patient will follow-up with their primary care provider. If they do not have a primary care provider, information for follow-up has been provided to them. All questions have been answered.  ____________________________________________  FINAL CLINICAL IMPRESSION(S) / ED DIAGNOSES  Final diagnoses:  Pyelonephritis  RLQ abdominal pain  Fever, unspecified fever cause     MEDICATIONS GIVEN DURING THIS VISIT:  Medications  sodium chloride 0.9 % bolus 1,000 mL (0 mLs Intravenous Stopped 04/12/18 1747)  iopamidol (ISOVUE-300) 61 % injection 30 mL (30 mLs Oral Contrast Given 04/12/18 1818)  iopamidol (ISOVUE-300) 61 % injection 100 mL (100 mLs Intravenous Contrast Given 04/12/18 1818)  cefTRIAXone (ROCEPHIN) 1 g in sodium chloride 0.9 % 100 mL IVPB (0 g Intravenous  Stopped 04/12/18 2016)     NEW OUTPATIENT MEDICATIONS STARTED DURING THIS VISIT:  Discharge Medication List as of 04/12/2018  8:21 PM    START taking these medications   Details  sulfamethoxazole-trimethoprim (BACTRIM DS,SEPTRA DS) 800-160 MG tablet Take 1 tablet by mouth 2 (two) times daily for 14 days., Starting Tue 04/12/2018, Until Tue 04/26/2018, Print        Note:  This document was prepared using Dragon voice recognition software and may include unintentional dictation errors.  Nanda Quinton, MD Emergency  Medicine    Kasha Howeth, [facility], MD 04/13/18 1113

## 2018-04-12 NOTE — Discharge Instructions (Signed)
You have been seen in the Emergency Department (ED) today for abdominal pain.  Your workup today suggests that you have a urinary tract infection (UTI) that may have spread to your kidneys. This will require 2 weeks of antibiotics.   Please take your antibiotic as prescribed and over-the-counter pain medication (Tylenol or Motrin) as needed, but no more than recommended on the label instructions.  Drink PLENTY of fluids.  Call your regular doctor to schedule the next available appointment to follow up on today?s ED visit, or return immediately to the ED if your pain worsens, you have decreased urine production, develop fever, persistent vomiting, or other symptoms that concern you.

## 2018-04-12 NOTE — ED Triage Notes (Signed)
Pt sent over by Dr. Willey Blade for a CT scan. Pt reports episodes of fever, loss of appetite fatigue, and RT lower abdominal pain x 3-4 weeks. Pt states she will get better then it will happen again.

## 2018-04-14 LAB — URINE CULTURE
Culture: >=100000 — AB
Special Requests: NORMAL

## 2018-04-15 ENCOUNTER — Telehealth: Payer: Self-pay

## 2018-04-15 NOTE — Telephone Encounter (Signed)
Post ED Visit - Positive Culture Follow-up  Culture report reviewed by antimicrobial stewardship pharmacist:  []  Elenor Quinones, Pharm.D. []  Heide Guile, Pharm.D., BCPS AQ-ID []  Parks Neptune, Pharm.D., BCPS []  Alycia Rossetti, Pharm.D., BCPS []  [place], Pharm.D., BCPS, AAHIVP []  Legrand Como, Pharm.D., BCPS, AAHIVP []  Salome Arnt, PharmD, BCPS []  Wynell Balloon, PharmD []  Vincenza Hews, PharmD, BCPS Brooke Baggett Pharm D Positive urine culture Treated with Sulfamethoxazole-Trimethoprim, organism sensitive to the same and no further patient follow-up is required at this time.  Genia Del 04/15/2018, 9:18 AM

## 2018-11-10 ENCOUNTER — Other Ambulatory Visit: Payer: Self-pay | Admitting: Cardiology

## 2018-11-10 NOTE — Telephone Encounter (Signed)
Rx request sent to pharmacy.  

## 2018-12-16 DIAGNOSIS — M20011 Mallet finger of right finger(s): Secondary | ICD-10-CM | POA: Insufficient documentation

## 2018-12-16 DIAGNOSIS — I10 Essential (primary) hypertension: Secondary | ICD-10-CM | POA: Insufficient documentation

## 2018-12-22 ENCOUNTER — Encounter: Payer: Self-pay | Admitting: Cardiology

## 2018-12-22 ENCOUNTER — Ambulatory Visit: Payer: Medicare Other | Admitting: Cardiology

## 2018-12-22 VITALS — BP 122/72 | HR 61 | Ht 63.0 in | Wt 115.8 lb

## 2018-12-22 DIAGNOSIS — R030 Elevated blood-pressure reading, without diagnosis of hypertension: Secondary | ICD-10-CM | POA: Diagnosis not present

## 2018-12-22 DIAGNOSIS — I471 Supraventricular tachycardia: Secondary | ICD-10-CM

## 2018-12-22 DIAGNOSIS — R42 Dizziness and giddiness: Secondary | ICD-10-CM | POA: Diagnosis not present

## 2018-12-22 MED ORDER — METOPROLOL SUCCINATE ER 50 MG PO TB24: 50.0000 mg | ORAL_TABLET | Freq: Every day | ORAL | 3 refills | Status: DC

## 2018-12-22 NOTE — Patient Instructions (Signed)
Medication Instructions:   STOP TAKING METOPROLOL TARTRATE AND USE ONLY ON AN AS NEEDED BASIS FOR BREAKTHROUGH.  START METOPROLOL SUCCINATE ( TOPROL XL) 50 MG  TAKE IN THE EVENING, ONCE DAILY If you need a refill on your cardiac medications before your next appointment, please call your pharmacy.   Lab work: Not needed If you have labs (blood work) drawn today and your tests are completely normal, you will receive your results only by: Marland Kitchen MyChart Message (if you have MyChart) OR . A paper copy in the mail If you have any lab test that is abnormal or we need to change your treatment, we will call you to review the results.  Testing/Procedures: Not needed  Follow-Up: At [facility], you and your health needs are our priority.  As part of our continuing mission to provide you with exceptional heart care, we have created designated Provider Care Teams.  These Care Teams include your primary Cardiologist (physician) and Advanced Practice Providers (APPs -  Physician Assistants and Nurse Practitioners) who all work together to provide you with the care you need, when you need it. You will need a follow up appointment in 12 months FEB 2021.  Please call our office 2 months in advance to schedule this appointment.  You may see [name], MD or one of the following Advanced Practice Providers on your designated Care Team:   Rosaria Ferries, PA-C . Jory Sims, DNP, ANP  Any Other Special Instructions Will Be Listed Below (If Applicable).

## 2018-12-22 NOTE — Progress Notes (Signed)
PCP: Asencion Noble, MD  Clinic Note: Chief Complaint  Patient presents with  . Follow-up    Delayed annual  . Palpitations    History of PSVT and PACs   HPI: Morgan [name] is a 68 y.o. female with a PMH of frequent PACs & PSVT who presents for annual f/u.  I last saw her in Nov 2018, doing OK.  Some orthostatic hypotension --> decreased metoprolol to 25 mg BID with 1/2 to 1 tab as PRN.  No recent hospitalizations or recent studies.  Interval History: Morgan [name] presents today doing very well.  She continues to be very active with her running and other exercises.  She is always on the go and quite busy.  She really has not had any breakthrough episodes of palpitations that she can remember.  No SVT episodes of rapid heart rates that were long lasting. No longer having any postural dizziness since we reduced her beta-blocker dose.  She says that she is a hard time remembering to take her morning dose of metoprolol and sometimes ends up taking both doses in the evening.  Despite this, she has not had any breakthrough spells where she had to take any additional beta-blocker.  Otherwise doing well.  No major cardiovascular complaints.  Cardiovascular Review of Symptoms:no chest pain or dyspnea on exertion negative for - edema, irregular heartbeat, loss of consciousness, murmur, orthopnea, palpitations, paroxysmal nocturnal dyspnea, rapid heart rate, shortness of breath or No melena, hematochezia, epistaxis or hematuria   ROS: A comprehensive was performed. Review of Systems  Constitutional: Negative for malaise/fatigue and weight loss (Pretty much intentional).  HENT: Negative for congestion.   Respiratory: Negative for cough, shortness of breath and wheezing.   Cardiovascular:       Per history of present illness  Gastrointestinal: Negative for blood in stool and heartburn.       Occasional indigestion  Genitourinary: Negative for dysuria and frequency.  Musculoskeletal: Negative for  falls and myalgias.  Neurological: Negative for dizziness, focal weakness, loss of consciousness and headaches.  Psychiatric/Behavioral: Negative for depression. The patient is not nervous/anxious.    Past Medical History:  Diagnosis Date  . Basal cell carcinoma   . Borderline hypertension   . H/O echocardiogram 2011   Normal ejection fraction greater than 50%. No wall motion abnormalities no valvular lesions.  . Hashimoto's thyroiditis   . Melanoma ([place])   . 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 Ascorbic Acid (VITAMIN C) 500 MG CHEW Chew 1 tablet by mouth daily.    . calcium carbonate (OS-CAL) 600 MG TABS tablet Take 600 mg by mouth daily.    . cholecalciferol (VITAMIN D) 1000 UNITS tablet Take 1,000 Units by mouth daily.    . Cyanocobalamin (B-12 PO) Take 1 tablet by mouth daily.    Marland Kitchen estradiol (ESTRACE) 1 MG tablet Take 1 tablet by mouth daily.    . fish oil-omega-3 fatty acids 1000 MG capsule Take 1 g by mouth daily.    . Multiple Vitamin (MULTIVITAMIN) tablet Take 1 tablet by mouth daily.    . nitrofurantoin, macrocrystal-monohydrate, (MACROBID) 100 MG capsule Take 100 mg by mouth See admin instructions. Take as needed/as directed for flares    . progesterone (PROMETRIUM) 200 MG capsule Take 200 mg by mouth every evening.   1  . vitamin E  400 UNIT capsule Take 400 Units by mouth daily.     No current facility-administered medications on file prior to visit.    Past Surgical History:  Procedure Laterality Date  . BUNIONECTOMY  2006  . NECK SURGERY  2006   Cosmetic    Allergies  Allergen Reactions  . Fish Allergy Anaphylaxis, Hives and Itching    Perch Fish only   Social History   Tobacco Use  . Smoking status: Never Smoker  . Smokeless tobacco: Never Used  Substance Use Topics  . Alcohol use: Yes     Comment: Socially  . Drug use: Never   Family History  Problem Relation Age of Onset  . Alzheimer's disease Father   . Parkinson's disease Maternal Grandmother   . Heart Problems Paternal Grandmother     Wt Readings from Last 3 Encounters:  12/22/18 115 lb 12.8 oz (52.5 kg)  04/12/18 105 lb (47.6 kg)  09/20/17 117 lb (53.1 kg)    PHYSICAL EXAM BP 122/72   Pulse 61   Ht 5\' 3"  (1.6 m)   Wt 115 lb 12.8 oz (52.5 kg)   BMI 20.51 kg/m   Physical Exam  Constitutional: She is oriented to person, place, and time. She appears well-developed and well-nourished. No distress.  HENT:  Head: Normocephalic and atraumatic.  Neck: Normal range of motion. Neck supple. No hepatojugular reflux and no JVD present. Carotid bruit is not present.  Cardiovascular: Normal rate, regular rhythm and normal heart sounds.  No extrasystoles are present. PMI is not displaced. Exam reveals no gallop and no friction rub.  No murmur heard. Pulmonary/Chest: Effort normal and breath sounds normal. No respiratory distress. She has no wheezes. She has no rales.  Musculoskeletal: Normal range of motion.        General: No edema.  Neurological: She is alert and oriented to person, place, and time.  Psychiatric: She has a normal mood and affect. Her behavior is normal. Judgment and thought content normal.  Nursing note and vitals reviewed.   Adult ECG Report  Rate: 61 ;  Rhythm: normal sinus rhythm and Borderline low voltage with nonspecific ST-T wave changes suggest possible septal MI, age undetermined. ;   Narrative Interpretation: Otherwise normal/stable EKG   Recent Labs: April 2019: TC 219, TG 159, LDL 119, HDL of 72.  Hemoglobin 12.3.  Creatinine 0.96.  K+ 3.5.  TSH 2.08.  ASSESSMENT / PLAN: Problem List Items Addressed This Visit    Borderline hypertension (Chronic)    Blood pressure looks well controlled on low-dose beta-blocker.      Orthostatic dizziness    Much better with reduced dose of  metoprolol.  At this point we will convert to once daily metoprolol and continue to stressed importance of aggressive hydration.      Paroxysmal SVT (supraventricular tachycardia) ([facility]) - Primary (Chronic)    History of AVNRT.  No further symptoms now. Continue the current beta-blocker, will convert to Toprol 50 mg once daily since she is actually just taking it once daily anyway.   Discussed vagal maneuvers, and avoiding triggers.      Relevant Medications   metoprolol succinate (TOPROL-XL) 50 MG 24 hr tablet   Other Relevant Orders   EKG 12-Lead       Orders Placed This Encounter  Procedures  . EKG 12-Lead   Meds ordered this encounter  Medications  . metoprolol succinate (TOPROL-XL) 50 MG 24 hr tablet    Sig: Take 1 tablet (50 mg total) by  mouth daily.    Dispense:  90 tablet    Refill:  3    Discontinue metoprolol tartrate dose.   Patient Instructions  Medication Instructions:   STOP TAKING METOPROLOL TARTRATE AND USE ONLY ON AN AS NEEDED BASIS FOR BREAKTHROUGH.  START METOPROLOL SUCCINATE ( TOPROL XL) 50 MG  TAKE IN THE EVENING, ONCE DAILY If you need a refill on your cardiac medications before your next appointment, please call your pharmacy.   Lab work: Not needed If you have labs (blood work) drawn today and your tests are completely normal, you will receive your results only by: Marland Kitchen MyChart Message (if you have MyChart) OR . A paper copy in the mail If you have any lab test that is abnormal or we need to change your treatment, we will call you to review the results.  Testing/Procedures: Not needed  Follow-Up: At [facility], you and your health needs are our priority.  As part of our continuing mission to provide you with exceptional heart care, we have created designated Provider Care Teams.  These Care Teams include your primary Cardiologist (physician) and Advanced Practice Providers (APPs -  Physician Assistants and Nurse Practitioners) who all work  together to provide you with the care you need, when you need it. You will need a follow up appointment in 12 months FEB 2021.  Please call our office 2 months in advance to schedule this appointment.  You may see [name], MD or one of the following Advanced Practice Providers on your designated Care Team:   Rosaria Ferries, PA-C . Jory Sims, DNP, ANP  Any Other Special Instructions Will Be Listed Below (If Applicable).      [name], M.D., M.S. Interventional Cardiologist   Pager # [phone]

## 2018-12-22 NOTE — Assessment & Plan Note (Signed)
Blood pressure looks well controlled on low-dose beta-blocker.

## 2018-12-22 NOTE — Assessment & Plan Note (Addendum)
History of AVNRT.  No further symptoms now. Continue the current beta-blocker, will convert to Toprol 50 mg once daily since she is actually just taking it once daily anyway.   Discussed vagal maneuvers, and avoiding triggers.

## 2018-12-22 NOTE — Assessment & Plan Note (Signed)
Much better with reduced dose of metoprolol.  At this point we will convert to once daily metoprolol and continue to stressed importance of aggressive hydration.

## 2019-11-29 DIAGNOSIS — L821 Other seborrheic keratosis: Secondary | ICD-10-CM | POA: Diagnosis not present

## 2019-11-29 DIAGNOSIS — L820 Inflamed seborrheic keratosis: Secondary | ICD-10-CM | POA: Diagnosis not present

## 2019-11-29 DIAGNOSIS — L111 Transient acantholytic dermatosis [Grover]: Secondary | ICD-10-CM | POA: Diagnosis not present

## 2019-11-29 DIAGNOSIS — L570 Actinic keratosis: Secondary | ICD-10-CM | POA: Diagnosis not present

## 2019-11-29 DIAGNOSIS — C44719 Basal cell carcinoma of skin of left lower limb, including hip: Secondary | ICD-10-CM | POA: Diagnosis not present

## 2019-11-29 DIAGNOSIS — L309 Dermatitis, unspecified: Secondary | ICD-10-CM | POA: Diagnosis not present

## 2019-11-29 DIAGNOSIS — Z85828 Personal history of other malignant neoplasm of skin: Secondary | ICD-10-CM | POA: Diagnosis not present

## 2019-12-20 ENCOUNTER — Telehealth: Payer: Self-pay | Admitting: Cardiology

## 2019-12-20 MED ORDER — METOPROLOL SUCCINATE ER 50 MG PO TB24: 50.0000 mg | ORAL_TABLET | Freq: Every day | ORAL | 3 refills | Status: DC

## 2019-12-20 NOTE — Telephone Encounter (Signed)
New Message   Patient needs a new prescription on   metoprolol succinate (TOPROL-XL) 50 MG 24 hr tablet(Expired)   Sent to [facility] [phone] - [place], [place] AT [address]

## 2020-01-15 ENCOUNTER — Other Ambulatory Visit: Payer: Self-pay

## 2020-01-15 ENCOUNTER — Encounter (INDEPENDENT_AMBULATORY_CARE_PROVIDER_SITE_OTHER): Payer: Self-pay

## 2020-01-15 ENCOUNTER — Ambulatory Visit (INDEPENDENT_AMBULATORY_CARE_PROVIDER_SITE_OTHER): Payer: Medicare PPO | Admitting: Cardiology

## 2020-01-15 ENCOUNTER — Encounter: Payer: Self-pay | Admitting: Cardiology

## 2020-01-15 VITALS — BP 124/78 | HR 57 | Temp 97.5°F | Ht 62.0 in | Wt 118.0 lb

## 2020-01-15 DIAGNOSIS — R030 Elevated blood-pressure reading, without diagnosis of hypertension: Secondary | ICD-10-CM | POA: Diagnosis not present

## 2020-01-15 DIAGNOSIS — E063 Autoimmune thyroiditis: Secondary | ICD-10-CM | POA: Diagnosis not present

## 2020-01-15 DIAGNOSIS — I471 Supraventricular tachycardia: Secondary | ICD-10-CM

## 2020-01-15 DIAGNOSIS — I10 Essential (primary) hypertension: Secondary | ICD-10-CM | POA: Diagnosis not present

## 2020-01-15 LAB — COMPREHENSIVE METABOLIC PANEL
ALT: 20 IU/L (ref 0–32)
AST: 23 IU/L (ref 0–40)
Albumin/Globulin Ratio: 1.9 (ref 1.2–2.2)
Albumin: 4.3 g/dL (ref 3.8–4.8)
Alkaline Phosphatase: 64 IU/L (ref 39–117)
BUN/Creatinine Ratio: 15 (ref 12–28)
BUN: 16 mg/dL (ref 8–27)
Bilirubin Total: 0.3 mg/dL (ref 0.0–1.2)
CO2: 24 mmol/L (ref 20–29)
Calcium: 9.7 mg/dL (ref 8.7–10.3)
Chloride: 102 mmol/L (ref 96–106)
Creatinine, Ser: 1.05 mg/dL — ABNORMAL HIGH (ref 0.57–1.00)
GFR calc Af Amer: 63 mL/min/{1.73_m2} (ref >59)
GFR calc non Af Amer: 54 mL/min/{1.73_m2} — ABNORMAL LOW (ref >59)
Globulin, Total: 2.3 g/dL (ref 1.5–4.5)
Glucose: 97 mg/dL (ref 65–99)
Potassium: 4.7 mmol/L (ref 3.5–5.2)
Sodium: 140 mmol/L (ref 134–144)
Total Protein: 6.6 g/dL (ref 6.0–8.5)

## 2020-01-15 LAB — LIPID PANEL
Chol/HDL Ratio: 3.4 ratio (ref 0.0–4.4)
Cholesterol, Total: 262 mg/dL — ABNORMAL HIGH (ref 100–199)
HDL: 76 mg/dL (ref >39)
LDL Chol Calc (NIH): 148 mg/dL — ABNORMAL HIGH (ref 0–99)
Triglycerides: 214 mg/dL — ABNORMAL HIGH (ref 0–149)
VLDL Cholesterol Cal: 38 mg/dL (ref 5–40)

## 2020-01-15 LAB — CBC
Hematocrit: 40.7 % (ref 34.0–46.6)
Hemoglobin: 13.3 g/dL (ref 11.1–15.9)
MCH: 31.3 pg (ref 26.6–33.0)
MCHC: 32.7 g/dL (ref 31.5–35.7)
MCV: 96 fL (ref 79–97)
Platelets: 276 10*3/uL (ref 150–450)
RBC: 4.25 x10E6/uL (ref 3.77–5.28)
RDW: 11.7 % (ref 11.7–15.4)
WBC: 5.7 10*3/uL (ref 3.4–10.8)

## 2020-01-15 LAB — TSH: TSH: 3.21 u[IU]/mL (ref 0.450–4.500)

## 2020-01-15 MED ORDER — METOPROLOL TARTRATE 25 MG PO TABS: ORAL_TABLET | ORAL | 4 refills | Status: DC

## 2020-01-15 NOTE — Patient Instructions (Addendum)
Medication Instructions:   continue with current medications  May [place] Metoprolol tartrate 25 mg as needed for rapid heart rate.  *If you need a refill on your cardiac medications before your next appointment, please call your pharmacy*   Lab Work: Lipid  CMP TSH CBC  If you have labs (blood work) drawn today and your tests are completely normal, you will receive your results only by: Marland Kitchen MyChart Message (if you have MyChart) OR . A paper copy in the mail If you have any lab test that is abnormal or we need to change your treatment, we will call you to review the results.   Testing/Procedures: Not needed   Follow-Up: At [facility], you and your health needs are our priority.  As part of our continuing mission to provide you with exceptional heart care, we have created designated Provider Care Teams.  These Care Teams include your primary Cardiologist (physician) and Advanced Practice Providers (APPs -  Physician Assistants and Nurse Practitioners) who all work together to provide you with the care you need, when you need it.  We recommend signing up for the patient portal called "MyChart".  Sign up information is provided on this After Visit Summary.  MyChart is used to connect with patients for Virtual Visits (Telemedicine).  Patients are able to view lab/test results, encounter notes, upcoming appointments, etc.  Non-urgent messages can be sent to your provider as well.   To learn more about what you can do with MyChart, go to [email or web address].    Your next appointment:   12 month(s)  The format for your next appointment:   In Person  Provider:   [name], MD   Other Instructions

## 2020-01-15 NOTE — Progress Notes (Signed)
Primary Care Provider: Asencion Noble, MD Cardiologist: [name], MD Electrophysiologist: None  Clinic Note: Chief Complaint  Patient presents with  . Follow-up    Annual  . Tachycardia    PACs and PSVT    HPI:    Morgan [name] is a 69 y.o. female with a PMH notable for frequent PACs and PSVT with a least 1 episode of syncope) below who presents today for annual follow-up.  Morgan [name] was last seen in February 2020 with no complaints.  Doing well.  Noted intentional weight loss.  No SVT symptoms.  One episode of postural dizziness since reducing beta-blocker dose.  Had not required additional as needed dosing of metoprolol.  Recent Hospitalizations: none  Reviewed  CV studies:    The following studies were reviewed today: (if available, images/films reviewed: From Epic Chart or Care Everywhere) . none:   Interval History:   Morgan [name] returns for her annual follow-up visit saying that she is done fairly well over the last year, but she has had increasing episodes of her rapid heart rate spells.  She has been under a lot of stress over the last year with a combination of COVID-19 and the fact she is in the process of going through a divorce.  She has been quite stressed out over this last year she says she has had a few breakthrough spells they probably last about 10 to 25 minutes.  She says that these usually resolve before she has a chance to think about taking extra beta-blocker.  At least one time she took an extra half a dose, but was 100% sure what the plan was.  Most the time these episodes happen later on in the day when she tried to relax and get settled down after long day.  With these episodes she will feel a little bit of mild flushing and dizziness.  But she also can have some dizziness off and on throughout the day if it is not associate with palpitations.  Otherwise stable from cardiac standpoint: CV Review of Symptoms (Summary) no chest pain or dyspnea on  exertion positive for - palpitations, rapid heart rate and As noted above negative for - edema, loss of consciousness, orthopnea, paroxysmal nocturnal dyspnea, shortness of breath or Dizziness, no syncope/near syncope or TIA/amaurosis fugax.  Claudication  The patient does not have symptoms concerning for COVID-19 infection (fever, chills, cough, or new shortness of breath).  She has completed both COVID-19 Moderna  vaccine shots  The patient is practicing social distancing & Masking.    REVIEWED OF SYSTEMS   Review of Systems  Constitutional: Negative for malaise/fatigue and weight loss.  HENT: Negative for nosebleeds.        She notes puffy swelling along the left side of her neck.  Also notes dry throat/mouth.  Respiratory: Positive for shortness of breath (Only when she has there is fast heart spells).   Gastrointestinal: Negative for blood in stool, heartburn, melena and nausea.  Genitourinary: Negative for hematuria.  Musculoskeletal: Negative for back pain and joint pain.  Neurological: Positive for dizziness. Negative for focal weakness, weakness and headaches.       She does often feel dizzy and flushed when she has palpitations.  Psychiatric/Behavioral: Negative for memory loss. The patient is nervous/anxious (A little bit, she has episodes where she feels like the palpitations are almost panic-like.). The patient does not have insomnia.        Lots of social stress, guilt, anger  etc. all revolving around her divorce    I have reviewed and (if needed) personally updated the patient's problem list, medications, allergies, past medical and surgical history, social and family history.   PAST MEDICAL HISTORY   Past Medical History:  Diagnosis Date  . Basal cell carcinoma   . Borderline hypertension   . H/O echocardiogram 2011   Normal ejection fraction greater than 50%. No wall motion abnormalities no valvular lesions.  . Hashimoto's thyroiditis   . Melanoma ([place])   .  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 BUNIONECTOMY  2006  . NECK SURGERY  2006   Cosmetic    MEDICATIONS/ALLERGIES   Current Meds  Medication Sig  . Ascorbic Acid (VITAMIN C) 500 MG CHEW Chew 1 tablet by mouth daily.  . calcium carbonate (OS-CAL) 600 MG TABS tablet Take 600 mg by mouth daily.  . cholecalciferol (VITAMIN D) 1000 UNITS tablet Take 1,000 Units by mouth daily.  . Cyanocobalamin (B-12 PO) Take 1 tablet by mouth daily.  Marland Kitchen estradiol (ESTRACE) 1 MG tablet Take 1 tablet by mouth daily.  . fish oil-omega-3 fatty acids 1000 MG capsule Take 1 g by mouth daily.  . metoprolol succinate (TOPROL-XL) 50 MG 24 hr tablet Take 1 tablet (50 mg total) by mouth daily.  . Multiple Vitamin (MULTIVITAMIN) tablet Take 1 tablet by mouth daily.  . nitrofurantoin, macrocrystal-monohydrate, (MACROBID) 100 MG capsule Take 100 mg by mouth See admin instructions. Take as needed/as directed for flares  . progesterone (PROMETRIUM) 200 MG capsule Take 200 mg by mouth every evening.   . triamcinolone cream (KENALOG) 0.1 %   . vitamin E 400 UNIT capsule Take 400 Units by mouth daily.  She does have a Toprol tartrate 25 mg tablets leftover.  We will provide a prescription for as needed dosing.  Allergies  Allergen Reactions  . Fish Allergy Anaphylaxis, Hives and Itching    Perch Fish only    SOCIAL HISTORY/FAMILY HISTORY   Reviewed in Epic  Pertinent findings:  Going through a divorce.  Lots of stress.  OBJCTIVE -PE, EKG, labs   Wt Readings from Last 3 Encounters:  01/15/20 118 lb (53.5 kg)  12/22/18 115 lb 12.8 oz (52.5 kg)  04/12/18 105 lb (47.6 kg)    Physical Exam: BP 124/78   Pulse (!) 57   Temp (!) 97.5 F (36.4 C)   Ht 5\' 2"  (1.575 m)   Wt 118 lb (53.5 kg)   SpO2 96%   BMI 21.58 kg/m  Physical Exam  Constitutional: She is  oriented to person, place, and time. She appears well-developed and well-nourished. No distress.  Healthy-appearing.  Well-groomed.  HENT:  Head: Normocephalic and atraumatic.  Neck: No hepatojugular reflux and no JVD present. Carotid bruit is not present.  Cardiovascular: Normal rate, regular rhythm, normal heart sounds and intact distal pulses. Exam reveals no gallop and no friction rub.  No murmur heard. Pulmonary/Chest: Effort normal and breath sounds normal. No respiratory distress. She has no wheezes. She has no rales.  Musculoskeletal:        General: No edema. Normal range of motion.     Cervical back: Normal range of motion and neck supple.  Lymphadenopathy:    She has no cervical adenopathy (Difficult to tell if it shotty lymphadenopathy versus small lateral swelling along the left thyroid  gland.  Nontender).  Neurological: She is alert and oriented to person, place, and time.  Psychiatric: She has a normal mood and affect. Her behavior is normal. Judgment and thought content normal.  Vitals reviewed.    Adult ECG Report  Rate: 57 ;  Rhythm: sinus bradycardia and Rightward axis.  Otherwise normal intervals and durations.;   Narrative Interpretation: Stable EKG  Recent Labs: April 2019: TC 219, TG 159, HDL 70, LDL at 119.  Hgb 12.3. Lab Results  Component Value Date   CHOL 262 (H) 01/15/2020   HDL 76 01/15/2020   LDLCALC 148 (H) 01/15/2020   TRIG 214 (H) 01/15/2020   CHOLHDL 3.4 01/15/2020   Lab Results  Component Value Date   CREATININE 1.05 (H) 01/15/2020   BUN 16 01/15/2020   NA 140 01/15/2020   K 4.7 01/15/2020   CL 102 01/15/2020   CO2 24 01/15/2020   Lab Results  Component Value Date   TSH 3.210 01/15/2020    ASSESSMENT/PLAN   Problem List Items Addressed This Visit    Paroxysmal SVT (supraventricular tachycardia) ([place]) - Primary (Chronic)    She is having intermittent breakthrough episodes.  I think that usually self-limited, but for breakthrough I  think short acting metoprolol will be better than distal dose of her long-acting. She does have metoprolol tartrate 25 mg tablets which we will simply have her take as needed for breakthrough spells.  Usually, she is able to break them with vagal maneuvers, but then once it do not break, she should take an extra metoprolol tartrate 25 mg tab.  Otherwise she will continue her current dose of Toprol.  Also try to avoid triggers.      Relevant Medications   metoprolol tartrate (LOPRESSOR) 25 MG tablet   Other Relevant Orders   EKG 12-Lead (Completed)   Lipid panel (Completed)   Comprehensive metabolic panel (Completed)   CBC (Completed)   TSH (Completed)   Borderline hypertension (Chronic)    Blood pressure pretty well controlled on current dose of Toprol.  She really did not have true hypertension.  As part of cardiovascular risk assessment, we will check lipid panel and CMP.      History of Hashimoto's thyroiditis (Chronic)    She had a history of thyroiditis, and has a mild swelling on the left thyroid area.  In addition to checking routine labs we will check TSH      Relevant Medications   metoprolol tartrate (LOPRESSOR) 25 MG tablet       COVID-19 Education: The signs and symptoms of COVID-19 were discussed with the patient and how to seek care for testing (follow up with PCP or arrange E-visit).   The importance of social distancing was discussed today.  I spent a total of 22 minutes with the patient. >  50% of the time was spent in direct patient consultation.  Additional time spent with chart review  / charting (studies, outside notes, etc): 6 Total Time: 28 min   Current medicines are reviewed at length with the patient today.  (+/- concerns) had questions about metoprolol worked to for breakthrough  Notice: This dictation was prepared with Dragon dictation along with smaller phrase technology. Any transcriptional errors that result from this process are unintentional  and may not be corrected upon review.  Patient Instructions / Medication Changes & Studies & Tests Ordered   Patient Instructions  Medication Instructions:   continue with current medications  May [place] Metoprolol tartrate 25 mg as needed  for rapid heart rate.  *If you need a refill on your cardiac medications before your next appointment, please call your pharmacy*   Lab Work: Lipid  CMP TSH CBC  If you have labs (blood work) drawn today and your tests are completely normal, you will receive your results only by: Marland Kitchen MyChart Message (if you have MyChart) OR . A paper copy in the mail If you have any lab test that is abnormal or we need to change your treatment, we will call you to review the results.   Testing/Procedures: Not needed   Follow-Up: At [facility], you and your health needs are our priority.  As part of our continuing mission to provide you with exceptional heart care, we have created designated Provider Care Teams.  These Care Teams include your primary Cardiologist (physician) and Advanced Practice Providers (APPs -  Physician Assistants and Nurse Practitioners) who all work together to provide you with the care you need, when you need it.  We recommend signing up for the patient portal called "MyChart".  Sign up information is provided on this After Visit Summary.  MyChart is used to connect with patients for Virtual Visits (Telemedicine).  Patients are able to view lab/test results, encounter notes, upcoming appointments, etc.  Non-urgent messages can be sent to your provider as well.   To learn more about what you can do with MyChart, go to [email or web address].    Your next appointment:   12 month(s)  The format for your next appointment:   In Person  Provider:   [name], MD   Other Instructions   Studies Ordered:   Orders Placed This Encounter  Procedures  . Lipid panel  . Comprehensive metabolic panel  . CBC  . TSH  . EKG 12-Lead      [name], M.D., M.S. Interventional Cardiologist   Pager # [phone] Phone # [phone] [address] [place], [place] [address]   Thank you for choosing Heartcare at [facility]!!

## 2020-01-17 ENCOUNTER — Encounter: Payer: Self-pay | Admitting: Cardiology

## 2020-01-17 NOTE — Assessment & Plan Note (Signed)
She had a history of thyroiditis, and has a mild swelling on the left thyroid area.  In addition to checking routine labs we will check TSH

## 2020-01-17 NOTE — Assessment & Plan Note (Signed)
She is having intermittent breakthrough episodes.  I think that usually self-limited, but for breakthrough I think short acting metoprolol will be better than distal dose of her long-acting. She does have metoprolol tartrate 25 mg tablets which we will simply have her take as needed for breakthrough spells.  Usually, she is able to break them with vagal maneuvers, but then once it do not break, she should take an extra metoprolol tartrate 25 mg tab.  Otherwise she will continue her current dose of Toprol.  Also try to avoid triggers.

## 2020-01-17 NOTE — Assessment & Plan Note (Addendum)
Blood pressure pretty well controlled on current dose of Toprol.  She really did not have true hypertension.  As part of cardiovascular risk assessment, we will check lipid panel and CMP.

## 2020-01-19 ENCOUNTER — Encounter: Payer: Self-pay | Admitting: *Deleted

## 2020-01-19 ENCOUNTER — Telehealth: Payer: Self-pay | Admitting: *Deleted

## 2020-01-19 DIAGNOSIS — E785 Hyperlipidemia, unspecified: Secondary | ICD-10-CM

## 2020-01-19 NOTE — Telephone Encounter (Signed)
-----   Message from [name], MD sent at 01/17/2020  7:26 PM EDT ----- Cholesterol level shows total cholesterol 262 with an LDL of 148 and triglycerides 314. -->  Would like to see LDL less than 130 if not close to 100.  For now I think a trial run of dietary modification, cutting back on foods high in cholesterol or animal fats along with continued exercise is a reasonable first choice.  We may want to talk about how to treat cholesterol between now and when you are supposed to see me back in a year.  We can potentially recheck your cholesterol levels in 6 months and then schedule a virtual visit to discuss results.

## 2020-01-19 NOTE — Telephone Encounter (Signed)
The patient has been notified of the result and verbalized understanding.  All questions (if any) were answered.  letter mailed with results  And labslip.  patient states she has some stressor in her life lately. Raiford Simmonds, RN 01/19/2020 6:39 PM

## 2020-04-05 DIAGNOSIS — Z85828 Personal history of other malignant neoplasm of skin: Secondary | ICD-10-CM | POA: Diagnosis not present

## 2020-04-05 DIAGNOSIS — B078 Other viral warts: Secondary | ICD-10-CM | POA: Diagnosis not present

## 2020-04-05 DIAGNOSIS — C44529 Squamous cell carcinoma of skin of other part of trunk: Secondary | ICD-10-CM | POA: Diagnosis not present

## 2020-04-05 DIAGNOSIS — L820 Inflamed seborrheic keratosis: Secondary | ICD-10-CM | POA: Diagnosis not present

## 2020-04-25 DIAGNOSIS — M8588 Other specified disorders of bone density and structure, other site: Secondary | ICD-10-CM | POA: Diagnosis not present

## 2020-04-25 DIAGNOSIS — Z01419 Encounter for gynecological examination (general) (routine) without abnormal findings: Secondary | ICD-10-CM | POA: Diagnosis not present

## 2020-04-25 DIAGNOSIS — Z1231 Encounter for screening mammogram for malignant neoplasm of breast: Secondary | ICD-10-CM | POA: Diagnosis not present

## 2020-04-25 DIAGNOSIS — N958 Other specified menopausal and perimenopausal disorders: Secondary | ICD-10-CM | POA: Diagnosis not present

## 2020-04-25 DIAGNOSIS — Z6821 Body mass index (BMI) 21.0-21.9, adult: Secondary | ICD-10-CM | POA: Diagnosis not present

## 2020-05-28 DIAGNOSIS — L814 Other melanin hyperpigmentation: Secondary | ICD-10-CM | POA: Diagnosis not present

## 2020-05-28 DIAGNOSIS — L820 Inflamed seborrheic keratosis: Secondary | ICD-10-CM | POA: Diagnosis not present

## 2020-05-28 DIAGNOSIS — L821 Other seborrheic keratosis: Secondary | ICD-10-CM | POA: Diagnosis not present

## 2020-05-28 DIAGNOSIS — Z85828 Personal history of other malignant neoplasm of skin: Secondary | ICD-10-CM | POA: Diagnosis not present

## 2020-05-28 DIAGNOSIS — C44519 Basal cell carcinoma of skin of other part of trunk: Secondary | ICD-10-CM | POA: Diagnosis not present

## 2020-05-28 DIAGNOSIS — L910 Hypertrophic scar: Secondary | ICD-10-CM | POA: Diagnosis not present

## 2020-05-28 DIAGNOSIS — D225 Melanocytic nevi of trunk: Secondary | ICD-10-CM | POA: Diagnosis not present

## 2020-06-10 DIAGNOSIS — Z1211 Encounter for screening for malignant neoplasm of colon: Secondary | ICD-10-CM | POA: Diagnosis not present

## 2020-06-13 LAB — COLOGUARD: COLOGUARD: NEGATIVE

## 2020-06-26 DIAGNOSIS — H2513 Age-related nuclear cataract, bilateral: Secondary | ICD-10-CM | POA: Diagnosis not present

## 2020-07-18 ENCOUNTER — Ambulatory Visit: Payer: Medicare PPO | Attending: Internal Medicine

## 2020-07-18 DIAGNOSIS — Z23 Encounter for immunization: Secondary | ICD-10-CM

## 2020-07-18 NOTE — Progress Notes (Signed)
   Covid-19 Vaccination Clinic  Name:  CARRISA KELLER    MRN: [id number] DOB: 1951/02/21  07/18/2020  Ms. Vanblarcom was observed post Covid-19 immunization for 15 minutes without incident. She was provided with Vaccine Information Sheet and instruction to access the V-Safe system.   Ms. Rapley was instructed to call 911 with any severe reactions post vaccine: Marland Kitchen Difficulty breathing  . Swelling of face and throat  . A fast heartbeat  . A bad rash all over body  . Dizziness and weakness

## 2020-07-30 DIAGNOSIS — L910 Hypertrophic scar: Secondary | ICD-10-CM | POA: Diagnosis not present

## 2020-07-30 DIAGNOSIS — B078 Other viral warts: Secondary | ICD-10-CM | POA: Diagnosis not present

## 2020-07-30 DIAGNOSIS — L738 Other specified follicular disorders: Secondary | ICD-10-CM | POA: Diagnosis not present

## 2020-07-30 DIAGNOSIS — Z85828 Personal history of other malignant neoplasm of skin: Secondary | ICD-10-CM | POA: Diagnosis not present

## 2020-09-05 DIAGNOSIS — Z23 Encounter for immunization: Secondary | ICD-10-CM | POA: Diagnosis not present

## 2020-09-16 ENCOUNTER — Other Ambulatory Visit: Payer: Self-pay | Admitting: Cardiology

## 2020-11-17 ENCOUNTER — Other Ambulatory Visit: Payer: Self-pay | Admitting: Cardiology

## 2020-12-10 DIAGNOSIS — L814 Other melanin hyperpigmentation: Secondary | ICD-10-CM | POA: Diagnosis not present

## 2020-12-10 DIAGNOSIS — Z85828 Personal history of other malignant neoplasm of skin: Secondary | ICD-10-CM | POA: Diagnosis not present

## 2020-12-10 DIAGNOSIS — L910 Hypertrophic scar: Secondary | ICD-10-CM | POA: Diagnosis not present

## 2020-12-10 DIAGNOSIS — L821 Other seborrheic keratosis: Secondary | ICD-10-CM | POA: Diagnosis not present

## 2020-12-24 DIAGNOSIS — L308 Other specified dermatitis: Secondary | ICD-10-CM | POA: Diagnosis not present

## 2020-12-24 DIAGNOSIS — L258 Unspecified contact dermatitis due to other agents: Secondary | ICD-10-CM | POA: Diagnosis not present

## 2021-01-07 DIAGNOSIS — L258 Unspecified contact dermatitis due to other agents: Secondary | ICD-10-CM | POA: Diagnosis not present

## 2021-01-16 DIAGNOSIS — E785 Hyperlipidemia, unspecified: Secondary | ICD-10-CM | POA: Diagnosis not present

## 2021-01-16 LAB — LIPID PANEL
Chol/HDL Ratio: 3.3 ratio (ref 0.0–4.4)
Cholesterol, Total: 229 mg/dL — ABNORMAL HIGH (ref 100–199)
HDL: 70 mg/dL (ref >39)
LDL Chol Calc (NIH): 135 mg/dL — ABNORMAL HIGH (ref 0–99)
Triglycerides: 139 mg/dL (ref 0–149)
VLDL Cholesterol Cal: 24 mg/dL (ref 5–40)

## 2021-01-27 ENCOUNTER — Encounter: Payer: Self-pay | Admitting: Cardiology

## 2021-01-27 ENCOUNTER — Ambulatory Visit: Payer: Medicare PPO | Admitting: Cardiology

## 2021-01-27 ENCOUNTER — Other Ambulatory Visit: Payer: Self-pay

## 2021-01-27 VITALS — BP 122/64 | HR 62 | Ht 63.0 in | Wt 120.0 lb

## 2021-01-27 DIAGNOSIS — E063 Autoimmune thyroiditis: Secondary | ICD-10-CM

## 2021-01-27 DIAGNOSIS — R5383 Other fatigue: Secondary | ICD-10-CM | POA: Diagnosis not present

## 2021-01-27 DIAGNOSIS — E785 Hyperlipidemia, unspecified: Secondary | ICD-10-CM

## 2021-01-27 DIAGNOSIS — I471 Supraventricular tachycardia: Secondary | ICD-10-CM

## 2021-01-27 DIAGNOSIS — R030 Elevated blood-pressure reading, without diagnosis of hypertension: Secondary | ICD-10-CM

## 2021-01-27 NOTE — Progress Notes (Signed)
Primary Care Provider: Asencion Noble, MD Cardiologist: [name], MD Electrophysiologist: None  Clinic Note: Chief Complaint  Patient presents with  . Follow-up    12 months.  . Tachycardia    Palpitations doing okay.   ===================================  ASSESSMENT/PLAN   Problem List Items Addressed This Visit    Fatigue    She has been feeling more tired of late, I think she may have gotten little bit deconditioned and has put on some weight.  She wants to make sure is not related to her thyroid levels.  We will check TSH and recheck her follow-up labs.      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  The symptoms are pretty well controlled on her current dose of Toprol.  She has been doing better with a long-acting Toprol as opposed to short acting beta-blocker.  She actually does not have any more of the short-acting metoprolol tartrate tablets left.  Has not required them after 2 years. Avoiding triggers such as decongestants or other stimulants.  Minimizing caffeine.  Maintaining adequate hydration      Relevant Orders   EKG 12-Lead (Completed)   Lipid panel   Comprehensive metabolic panel   TSH   CBC   Borderline hypertension (Chronic)    Blood pressure is extremely well controlled on Toprol only.      Relevant Orders   Lipid panel   Comprehensive metabolic panel   TSH   CBC   Hyperlipidemia with target LDL less than 100 (Chronic)    We reviewed her lipids today.  Poorly controlled.  Plan will be to have her increase her fish oil to 3 tablets a day, and change her diet to avoid animal fats, and increase her exercise level.  We will reassess in 6 months.  At that point we may need to consider adding a statin.      History of Hashimoto's thyroiditis (Chronic)     Follow-up TSH with rest of her labs in 6 months.      Relevant Orders   Comprehensive metabolic panel   TSH   CBC     ===================================  HPI:    Morgan [name] is a 70 y.o. female with a PMH notable for PSVT, borderline hypertension and hyperlipidemia who presents today for annual follow-up.  [facility] was last seen on January 15, 2020 -> did fairly well over the previous year.  Was noticing increasing episodes of rapid heart rate.  Under lots of stress because of COVID-19 and try to finalize divorce.  Has had several episodes lasting maybe 10 to 20 minutes.  Usually resolved before she actually is taking additional PRN metoprolol.  She is able to relax and calm herself down.  Does not low-dose shortness of breath and dizziness associated with symptoms.  Feels anxious when the palpitations happen with tends to make them worse.  Symptoms feel almost like a panic attack.  Recent Hospitalizations: None  Reviewed  CV studies:    The following studies were reviewed today: (if available, images/films reviewed: From Epic Chart or Care Everywhere) . None:   Interval History:   Morgan [name] returns for annual follow-up doing pretty well.  She is now living alone in her new home in [place].  She  is failing all the stress of having to maintain a house.  There is a lot of stress and anxiety associated with this, but she has been doing okay without having any prolonged palpitations.  She is able to control things.  Has not had to use any metoprolol.  No real spells.  She has been noticing little bit of fatigue of late, just has not really been doing much of anything according to her routine eyes.  Got out of the habit with all the stress and anxiety of the divorce and then moving.  She feels like she is put on weight.  As such, she is little more short of breath than usual with activity, but it acknowledges being out of shape.  She also has been dealing with some allergies of  late has congestion.  CV Review of Symptoms (Summary): no chest pain or dyspnea on exertion positive for - Short-lived palpitations but nothing prolonged-related anxiety.  Nothing more than a few seconds.  More fatigue than usual, more deconditioned negative for - chest pain, edema, irregular heartbeat, orthopnea, paroxysmal nocturnal dyspnea, shortness of breath or Syncope or near syncope, TIA/amaurosis fugax, claudication  The patient does not have symptoms concerning for COVID-19 infection (fever, chills, cough, or new shortness of breath).   REVIEWED OF SYSTEMS   ROS  She notes puffy swelling along the left side of her neck.  Also notes dry throat/mouth.  Respiratory: Positive for shortness of breath (Only when she has there is fast heart spells).   Gastrointestinal: Negative for blood in stool, heartburn, melena and nausea.  Genitourinary: Negative for hematuria.  Musculoskeletal: Negative for back pain and joint pain.  Neurological: Positive for dizziness. Negative for focal weakness, weakness and headaches.       She does often feel dizzy and flushed when she has palpitations.  Psychiatric/Behavioral: Negative for memory loss. The patient is nervous/anxious (A little bit, she has episodes where she feels like the palpitations are almost panic-like.). The patient does not have insomnia.        Lots of social stress, guilt, anger etc. all revolving around her divorce   I have reviewed and (if needed) personally updated the patient's problem list, medications, allergies, past medical and surgical history, social and family history.   PAST MEDICAL HISTORY   Past Medical History:  Diagnosis Date  . Basal cell carcinoma   . Borderline hypertension   . H/O echocardiogram 2011   Normal ejection fraction greater than 50%. No wall motion abnormalities no valvular lesions.  . Hashimoto's thyroiditis   . Melanoma ([place])   . 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 BUNIONECTOMY  2006  . NECK SURGERY  2006   Cosmetic    Immunization History  Administered Date(s) Administered  . Moderna Sars-Covid-2 Vaccination 07/18/2020    MEDICATIONS/ALLERGIES   Current Meds  Medication Sig  . Ascorbic Acid (VITAMIN C) 500 MG CHEW Chew 1 tablet by mouth daily.  . calcium carbonate (OS-CAL) 600 MG TABS tablet Take 600 mg by mouth daily.  . cholecalciferol (VITAMIN D) 1000 UNITS tablet Take 1,000 Units by mouth daily.  . Cyanocobalamin (B-12 PO) Take 1 tablet by mouth daily.  Marland Kitchen estradiol (ESTRACE) 1 MG tablet Take 1 tablet by mouth daily.  . metoprolol succinate (TOPROL-XL) 50 MG 24 hr tablet TAKE 1 TABLET(50 MG)  BY MOUTH DAILY  . Multiple Vitamin (MULTIVITAMIN) tablet Take 1 tablet by mouth daily.  . nitrofurantoin, macrocrystal-monohydrate, (MACROBID) 100 MG capsule Take 100 mg by mouth See admin instructions. Take as needed/as directed for flares  . progesterone (PROMETRIUM) 200 MG capsule Take 200 mg by mouth every evening.   . triamcinolone cream (KENALOG) 0.1 %   . vitamin E 400 UNIT capsule Take 400 Units by mouth daily.  . [DISCONTINUED] fish oil-omega-3 fatty acids 1000 MG capsule Take 1 g by mouth daily.  . [DISCONTINUED] metoprolol tartrate (LOPRESSOR) 25 MG tablet Take 25 mg tablet if needed for rapid Heartbeat    Allergies  Allergen Reactions  . Fish Allergy Anaphylaxis, Hives and Itching    Perch Fish only    SOCIAL HISTORY/FAMILY HISTORY   Reviewed in Epic:  Pertinent findings:  Social History   Tobacco Use  . Smoking status: Never Smoker  . Smokeless tobacco: Never Used  Vaping Use  . Vaping Use: Never used  Substance Use Topics  . Alcohol use: Yes    Comment: Socially  . Drug use: Never   Social History   Social History Narrative   Now divorced, mother of 3.   Actively exercising, enjoys  running and doing aerobics at the gym..   She exercises in the gym and also runs outside.   Does not smoke, or drink alcohol.      Has recently moved into [place] having finalized her divorce.  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      OBJCTIVE -PE, EKG, labs   Wt Readings from Last 3 Encounters:  01/27/21 120 lb (54.4 kg)  01/15/20 118 lb (53.5 kg)  12/22/18 115 lb 12.8 oz (52.5 kg)    Physical Exam: BP 122/64 (BP Location: Left Arm, Patient Position: Sitting, Cuff Size: Normal)   Pulse 62   Ht 5\' 3"  (1.6 m)   Wt 120 lb (54.4 kg)   BMI 21.26 kg/m  Physical Exam Constitutional:      General: She is not in acute distress.    Appearance: Normal appearance. She is not ill-appearing.     Comments: Healthy.Vita Barley to be younger than stated age.  Well-groomed.  HENT:     Head: Normocephalic and atraumatic.  Neck:     Vascular: No carotid bruit.  Cardiovascular:     Rate and Rhythm: Normal rate and regular rhythm.     Pulses: Normal pulses.     Heart sounds: Normal heart sounds. No murmur heard. No friction rub. No gallop.   Pulmonary:     Effort: Pulmonary effort is normal. No respiratory distress.     Breath sounds: Normal breath sounds.  Chest:     Chest wall: No tenderness.  Musculoskeletal:        General: No swelling. Normal range of motion.     Cervical back: Normal range of motion and neck supple.  Skin:    General: Skin is warm and dry.  Neurological:     General: No focal deficit present.     Mental Status: She is alert and oriented to person, place, and time.     Motor: No weakness.     Gait: Gait normal.  Psychiatric:        Mood and Affect: Mood normal.        Behavior: Behavior normal.        Thought Content: Thought content normal.  Judgment: Judgment normal.     Adult ECG Report  Rate: 62 ;  Rhythm: normal sinus rhythm and Normal axis, intervals and durations.;   Narrative  Interpretation: Normal  Recent Labs: Reviewed Lab Results  Component Value Date   CHOL 229 (H) 01/16/2021   HDL 70 01/16/2021   LDLCALC 135 (H) 01/16/2021   TRIG 139 01/16/2021   CHOLHDL 3.3 01/16/2021   Lab Results  Component Value Date   CREATININE 1.05 (H) 01/15/2020   BUN 16 01/15/2020   NA 140 01/15/2020   K 4.7 01/15/2020   CL 102 01/15/2020   CO2 24 01/15/2020   CBC Latest Ref Rng & Units 01/15/2020 04/12/2018  WBC 3.4 - 10.8 x10E3/uL 5.7 15.2(H)  Hemoglobin 11.1 - 15.9 g/dL 13.3 12.3  Hematocrit 34.0 - 46.6 % 40.7 37.0  Platelets 150 - 450 x10E3/uL 276 272    Lab Results  Component Value Date   TSH 3.210 01/15/2020    ==================================================  COVID-19 Education: The signs and symptoms of COVID-19 were discussed with the patient and how to seek care for testing (follow up with PCP or arrange E-visit).   The importance of social distancing and COVID-19 vaccination was discussed today. The patient is practicing social distancing & Masking.   I spent a total of 38minutes with the patient spent in direct patient consultation.  Additional time spent with chart review  / charting (studies, outside notes, etc): 10 min Total Time: 45 min   Current medicines are reviewed at length with the patient today.  (+/- concerns) n/a  This visit occurred during the SARS-CoV-2 public health emergency.  Safety protocols were in place, including screening questions prior to the visit, additional usage of staff PPE, and extensive cleaning of exam room while observing appropriate contact time as indicated for disinfecting solutions.  Notice: This dictation was prepared with Dragon dictation along with smaller phrase technology. Any transcriptional errors that result from this process are unintentional and may not be corrected upon review.  Patient Instructions / Medication Changes & Studies & Tests Ordered   Patient Instructions  Medication Instructions:   NO CHANGES  *If you need a refill on your cardiac medications before your next appointment, please call your pharmacy*   Lab Work:  6 month - [place] -( OCT 2022)   If you have labs (blood work) drawn today and your tests are completely normal, you will receive your results only by: Marland Kitchen MyChart Message (if you have MyChart) OR . A paper copy in the mail If you have any lab test that is abnormal or we need to change your treatment, we will call you to review the results.   Testing/Procedures: Not needed   Follow-Up: At [facility], you and your health needs are our priority.  As part of our continuing mission to provide you with exceptional heart care, we have created designated Provider Care Teams.  These Care Teams include your primary Cardiologist (physician) and Advanced Practice Providers (APPs -  Physician Assistants and Nurse Practitioners) who all work together to provide you with the care you need, when you need it.  We recommend signing up for the patient portal called "MyChart".  Sign up information is provided on this After Visit Summary.  MyChart is used to connect with patients for Virtual Visits (Telemedicine).  Patients are able to view lab/test results, encounter notes, upcoming appointments, etc.  Non-urgent messages can be sent to your provider as well.   To learn more about  what you can do with MyChart, go to [email or web address].    Your next appointment:   12 month(s)  The format for your next appointment:   In Person  Provider:   [name], MD   Other Instructions  Establish  With primary - [place] [facility] [place] physician discussed the importance of regular exercise and DIET  recommended that you start or continue a regular exercise program for good health.     Studies Ordered:   Orders Placed This Encounter  Procedures  . Lipid panel  . Comprehensive metabolic panel  . TSH  . CBC  . EKG  12-Lead     [name], M.D., M.S. Interventional Cardiologist   Pager # [phone] Phone # [phone] [address] [place], [place] [address]   Thank you for choosing Heartcare at [facility]!!

## 2021-01-27 NOTE — Patient Instructions (Signed)
Medication Instructions:  NO CHANGES  *If you need a refill on your cardiac medications before your next appointment, please call your pharmacy*   Lab Work:  [place] -( OCT 2022)   If you have labs (blood work) drawn today and your tests are completely normal, you will receive your results only by: Marland Kitchen MyChart Message (if you have MyChart) OR . A paper copy in the mail If you have any lab test that is abnormal or we need to change your treatment, we will call you to review the results.   Testing/Procedures: Not needed   Follow-Up: At [facility], you and your health needs are our priority.  As part of our continuing mission to provide you with exceptional heart care, we have created designated Provider Care Teams.  These Care Teams include your primary Cardiologist (physician) and Advanced Practice Providers (APPs -  Physician Assistants and Nurse Practitioners) who all work together to provide you with the care you need, when you need it.  We recommend signing up for the patient portal called "MyChart".  Sign up information is provided on this After Visit Summary.  MyChart is used to connect with patients for Virtual Visits (Telemedicine).  Patients are able to view lab/test results, encounter notes, upcoming appointments, etc.  Non-urgent messages can be sent to your provider as well.   To learn more about what you can do with MyChart, go to [email or web address].    Your next appointment:   12 month(s)  The format for your next appointment:   In Person  Provider:   [name], MD   Other Instructions  Establish  With primary -  at [place] physician discussed the importance of regular exercise and DIET  recommended that you start or continue a regular exercise program for good health.

## 2021-01-28 ENCOUNTER — Telehealth: Payer: Self-pay | Admitting: Cardiology

## 2021-01-28 MED ORDER — FISH OIL 1200 MG PO CAPS: 1200.0000 mg | ORAL_CAPSULE | Freq: Every day | ORAL | Status: AC

## 2021-01-28 NOTE — Telephone Encounter (Signed)
Yes, that would be fine to take 3x a day

## 2021-01-28 NOTE — Telephone Encounter (Signed)
Pt c/o medication issue:  1. Name of Medication: fish oil-omega-3 fatty acids 1200-360 MG  2. How are you currently taking this medication (dosage and times per day)? 1 tablet a day   3. Are you having a reaction (difficulty breathing--STAT)? No   4. What is your medication issue? Morgan [name] is calling stating [name] advised her to increase this medication to 3 tablets per day. She states she just wanted to confirm she should still make this increase due to her tablets being 1200 MG's instead of the 1000 MG's he originally thought they were when they were discussing this. Please advise.

## 2021-01-28 NOTE — Telephone Encounter (Signed)
Spoke with pt, yesterday when she saw dr Ellyn Hack, he wanted her to increase her 1000 mg fish oil daily to 3 tablets daily. She actually has the 1200 mg tablets and wants to make sure it is still okay to increase and take 3 tablets daily since the mg is more than he thought. Aware dr Ellyn Hack is not in the office today but will forward to the pharm md to advise.

## 2021-01-29 NOTE — Telephone Encounter (Signed)
Left message for patient of recommendations.

## 2021-02-09 ENCOUNTER — Encounter: Payer: Self-pay | Admitting: Cardiology

## 2021-02-09 DIAGNOSIS — E785 Hyperlipidemia, unspecified: Secondary | ICD-10-CM | POA: Insufficient documentation

## 2021-02-09 NOTE — Assessment & Plan Note (Addendum)
We reviewed her lipids today.  Poorly controlled.  Plan will be to have her increase her fish oil to 3 tablets a day, and change her diet to avoid animal fats, and increase her exercise level.  We will reassess in 6 months.  At that point we may need to consider adding a statin.

## 2021-02-09 NOTE — Assessment & Plan Note (Signed)
Blood pressure is extremely well controlled on Toprol only.

## 2021-02-09 NOTE — Assessment & Plan Note (Addendum)
Follow-up TSH with rest of her labs in 6 months.

## 2021-02-09 NOTE — Assessment & Plan Note (Signed)
She has been feeling more tired of late, I think she may have gotten little bit deconditioned and has put on some weight.  She wants to make sure is not related to her thyroid levels.  We will check TSH and recheck her follow-up labs.

## 2021-02-09 NOTE — Assessment & Plan Note (Addendum)
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  The symptoms are pretty well controlled on her current dose of Toprol.  She has been doing better with a long-acting Toprol as opposed to short acting beta-blocker.  She actually does not have any more of the short-acting metoprolol tartrate tablets left.  Has not required them after 2 years. Avoiding triggers such as decongestants or other stimulants.  Minimizing caffeine.  Maintaining adequate hydration

## 2021-05-12 DIAGNOSIS — L309 Dermatitis, unspecified: Secondary | ICD-10-CM | POA: Diagnosis not present

## 2021-05-12 DIAGNOSIS — L821 Other seborrheic keratosis: Secondary | ICD-10-CM | POA: Diagnosis not present

## 2021-05-12 DIAGNOSIS — D225 Melanocytic nevi of trunk: Secondary | ICD-10-CM | POA: Diagnosis not present

## 2021-05-12 DIAGNOSIS — Z85828 Personal history of other malignant neoplasm of skin: Secondary | ICD-10-CM | POA: Diagnosis not present

## 2021-05-12 DIAGNOSIS — L814 Other melanin hyperpigmentation: Secondary | ICD-10-CM | POA: Diagnosis not present

## 2021-07-17 DIAGNOSIS — Z6823 Body mass index (BMI) 23.0-23.9, adult: Secondary | ICD-10-CM | POA: Diagnosis not present

## 2021-07-17 DIAGNOSIS — Z1231 Encounter for screening mammogram for malignant neoplasm of breast: Secondary | ICD-10-CM | POA: Diagnosis not present

## 2021-07-17 DIAGNOSIS — Z124 Encounter for screening for malignant neoplasm of cervix: Secondary | ICD-10-CM | POA: Diagnosis not present

## 2021-08-01 ENCOUNTER — Other Ambulatory Visit: Payer: Self-pay | Admitting: Internal Medicine

## 2021-08-01 DIAGNOSIS — Z1339 Encounter for screening examination for other mental health and behavioral disorders: Secondary | ICD-10-CM | POA: Diagnosis not present

## 2021-08-01 DIAGNOSIS — E069 Thyroiditis, unspecified: Secondary | ICD-10-CM | POA: Diagnosis not present

## 2021-08-01 DIAGNOSIS — J329 Chronic sinusitis, unspecified: Secondary | ICD-10-CM | POA: Diagnosis not present

## 2021-08-01 DIAGNOSIS — E041 Nontoxic single thyroid nodule: Secondary | ICD-10-CM

## 2021-08-01 DIAGNOSIS — E785 Hyperlipidemia, unspecified: Secondary | ICD-10-CM | POA: Diagnosis not present

## 2021-08-01 DIAGNOSIS — Z1331 Encounter for screening for depression: Secondary | ICD-10-CM | POA: Diagnosis not present

## 2021-08-01 DIAGNOSIS — Z23 Encounter for immunization: Secondary | ICD-10-CM | POA: Diagnosis not present

## 2021-08-04 DIAGNOSIS — E785 Hyperlipidemia, unspecified: Secondary | ICD-10-CM | POA: Diagnosis not present

## 2021-08-04 DIAGNOSIS — E041 Nontoxic single thyroid nodule: Secondary | ICD-10-CM | POA: Diagnosis not present

## 2021-08-11 ENCOUNTER — Ambulatory Visit
Admission: RE | Admit: 2021-08-11 | Discharge: 2021-08-11 | Disposition: A | Discharge status: Home / Self Care | Payer: Medicare PPO | Source: Ambulatory Visit | Attending: Internal Medicine | Admitting: Internal Medicine

## 2021-08-11 DIAGNOSIS — E041 Nontoxic single thyroid nodule: Secondary | ICD-10-CM | POA: Diagnosis not present

## 2021-08-14 ENCOUNTER — Telehealth: Payer: Self-pay | Admitting: Cardiology

## 2021-08-14 NOTE — Telephone Encounter (Signed)
New Message:      Patient had lab work at her primary doctor. She asked that the results be sent to Dr Ellyn Hack. Her primary doctor wants her to start taking Crestor. Patient would like for Dr Ellyn Hack to review her lab work and see if he  agree that she needs to be on Crestor. He know what would be best for her, because he knows her condition and history.

## 2021-08-14 NOTE — Telephone Encounter (Signed)
Pt is willing to start cholesterol  medicine if Dr Ellyn Hack agrees with PCP Looked for labs could not locate Called pt and will have PCP resend results. Pt's PCP is wanting to start Crestor .

## 2021-08-16 NOTE — Telephone Encounter (Signed)
Completely agree with starting Crestor.  I take Crestor.  Think it is a good drug.  Probably would start at 20 mg-but would take 1/2 tablet daily for about 2 weeks and then increase to full tablet  [name], MD

## 2021-08-18 NOTE — Telephone Encounter (Addendum)
Pt aware of recommendations and verbalizes understanding will F/U  with PCP ./cy   Labs reviewed:   08/04/2021 Na+ 138, K+ 4.9, Cl- 105, HCO3- 23 , BUN 11, Cr 0.9, Glu 97, Ca2+ 8.8; AST 19, ALT 16, AlkP 58 CBC: W 6.28, H/H 14.4/39.7, Plt 268 TC 239, TG 207, HDL 69, LDL 129   Agree with statin.    [name], MD

## 2021-11-05 DIAGNOSIS — Z85828 Personal history of other malignant neoplasm of skin: Secondary | ICD-10-CM | POA: Diagnosis not present

## 2021-11-05 DIAGNOSIS — L111 Transient acantholytic dermatosis [Grover]: Secondary | ICD-10-CM | POA: Diagnosis not present

## 2021-11-05 DIAGNOSIS — L820 Inflamed seborrheic keratosis: Secondary | ICD-10-CM | POA: Diagnosis not present

## 2021-11-05 DIAGNOSIS — L821 Other seborrheic keratosis: Secondary | ICD-10-CM | POA: Diagnosis not present

## 2021-11-05 DIAGNOSIS — D225 Melanocytic nevi of trunk: Secondary | ICD-10-CM | POA: Diagnosis not present

## 2021-11-05 DIAGNOSIS — I788 Other diseases of capillaries: Secondary | ICD-10-CM | POA: Diagnosis not present

## 2021-11-05 DIAGNOSIS — L814 Other melanin hyperpigmentation: Secondary | ICD-10-CM | POA: Diagnosis not present

## 2021-11-20 ENCOUNTER — Other Ambulatory Visit: Payer: Self-pay | Admitting: Cardiology

## 2021-12-11 ENCOUNTER — Telehealth: Payer: Self-pay | Admitting: Cardiology

## 2021-12-11 NOTE — Telephone Encounter (Signed)
Sure she can take an extra dose of the metoprolol in the morning, but also needs to increase amount of fluid intake she is getting.  She needs to hydrate fully at least 60-72 ounces of water a day.  Also would recommend reducing caffeine intake until this spell resolves.  [name], MD

## 2021-12-11 NOTE — Telephone Encounter (Signed)
Spoke with patient who reports having more episodes of svt. She will convert herself to a regular rhythm by coughing, holding breath, or raising arms. She states she drinks 1-2 cups caffeinated coffee every morning and does not hydrate during the day, except for a glass of water. She notices the svt more when she is reclining. She asked if she should take a dose of metoprolol succinite 25 mg in the morning. She takes 50 mg at night. Please advise.

## 2021-12-11 NOTE — Telephone Encounter (Signed)
Patient c/o Palpitations:  High priority if patient c/o lightheadedness, shortness of breath, or chest pain  How long have you had palpitations/irregular HR/ Afib? Are you having the symptoms now? No but did earlier   Are you currently experiencing lightheadedness, SOB or CP? no  Do you have a history of afib (atrial fibrillation) or irregular heart rhythm? yes  Have you checked your BP or HR? (document readings if available): no  Are you experiencing any other symptoms? Dizziness couple months ago

## 2021-12-12 NOTE — Telephone Encounter (Signed)
Spoke with patient and informed her to take metoprolol 25 mg in the morning, along with her 50 mg nightly dose. She voiced understanding and will contact us next week on how she is feeling.

## 2021-12-12 NOTE — Telephone Encounter (Signed)
25mg

## 2022-01-05 ENCOUNTER — Telehealth: Payer: Self-pay | Admitting: Cardiology

## 2022-01-05 NOTE — Telephone Encounter (Signed)
?  Patient states that the additional dosage of 20 mg of metoprolol succinate (TOPROL-XL) 50 MG 24 hr tablet has helped her SVT's. She says she does have occasional episodes but not like before. She has also been trying to drink more water and less caffeine. She would like to know if she should continue to take the extra dose of medication. Please advise.  ?

## 2022-01-05 NOTE — Telephone Encounter (Signed)
Patient returned call, states that she is doing well since doing the extra 25 mg in the morning, and 50 mg in the evening like previous message stated.  ? ?Patient also states she is working on drinking more water, would you like to update RX to states 25 in the morning and 50 in the evening?  ? ?Thanks! ? ?

## 2022-01-06 MED ORDER — METOPROLOL SUCCINATE ER 50 MG PO TB24: ORAL_TABLET | ORAL | 2 refills | Status: DC

## 2022-01-06 NOTE — Telephone Encounter (Signed)
ALREADY SENT 

## 2022-01-06 NOTE — Telephone Encounter (Signed)
Noted.  ?I updated RX.  ? ?Thank you!  ? ?

## 2022-01-06 NOTE — Telephone Encounter (Signed)
Patient wants script to be sent to [place], [address] AT [place].   ?

## 2022-02-12 ENCOUNTER — Telehealth: Payer: Self-pay | Admitting: Cardiology

## 2022-02-12 NOTE — Telephone Encounter (Signed)
Called pt to let her know I spoke to both [facility] in [place] and [place], the [place] store was able to pull her prescription from the other store and get it filled. Pt states "I thought we sent it in, I was not sure what happened. Thank you for getting it worked out. I am going to run out of my pills tomorrow."  ?

## 2022-02-12 NOTE — Telephone Encounter (Signed)
Patient called stating she is going to run out of pills.  She needs a script called in with more pills to be dispensed as she takes 1 pill in the morning  and 1/2 pill in the eveing of metoprolol succinate (TOPROL-XL) 50 MG 24 hr tablet. ?She needs a new script called into [phone] - [place], [place] AT [place] ? ?

## 2022-02-15 ENCOUNTER — Other Ambulatory Visit: Payer: Self-pay | Admitting: Cardiology

## 2022-03-11 ENCOUNTER — Telehealth: Payer: Self-pay | Admitting: Cardiology

## 2022-03-11 NOTE — Telephone Encounter (Signed)
Would wait for MD input, do not think this is beta blocker related. ?

## 2022-03-11 NOTE — Telephone Encounter (Signed)
Patient informed of pharmd response. "Would wait for MD input, do not think this is beta blocker related." I suggested for patient to go ahead and get an appointment with ENT while we wiat for [name] to respond. She agreed. ?

## 2022-03-11 NOTE — Telephone Encounter (Signed)
Patient reports that when she started the morning dose of 25 mg metoprolol succinate, she noticed "tinnitus in my left ear."She describes it as a shrill, hissing sound. She said her palpitations are "very well controlled." She does have allergies and her ear pop and crackle. She had RSV in this past winter season. She has not seen ENT yet. Her concern is the met succ is causing tinnitus. Please advise. ?

## 2022-03-11 NOTE — Telephone Encounter (Signed)
?  Pt c/o medication issue: ? ?1. Name of Medication: metoprolol succinate (TOPROL-XL) 50 MG 24 hr tablet ? ?2. How are you currently taking this medication (dosage and times per day)? TAKE 1 TABLET(50 MG) BY MOUTH DAILY ? ?3. Are you having a reaction (difficulty breathing--STAT)?  ? ?4. What is your medication issue? Pt said, [name] increased her metoprolol since she take the extra dose of metoprolol she starting to get tinnitus in her left ear. She wanted to know if that is a normal side effect and if that wont affect her hearing. ?

## 2022-03-11 NOTE — Telephone Encounter (Signed)
I agree with PHarm D ?

## 2022-03-11 NOTE — Telephone Encounter (Signed)
Tinnitus is not a commonly seen side effect of beta blockers, although there are a few case reports. Could try splitting her dose into 1/2 tablet BID to see if she tolerates better, or could try alternate beta blocker if MD wishes. Agree with seeing ENT to rule out other causes. ?

## 2022-03-11 NOTE — Telephone Encounter (Signed)
Patient informed of pharmd response.Marland KitchenMarland Kitchen"Tinnitus is not a commonly seen side effect of beta blockers, although there are a few case reports. Could try splitting her dose into 1/2 tablet BID to see if she tolerates better, or could try alternate beta blocker if MD wishes. Agree with seeing ENT to rule out other causes." ?Patient concerned about dosing because she take metoprolol succinate 25 mg in the morning and 50 mg at night. Please advise. ?

## 2022-03-15 NOTE — Telephone Encounter (Signed)
I would simply reduce to 25 mg twice daily Toprol to see if it works.   [name], MD

## 2022-03-16 NOTE — Telephone Encounter (Signed)
Patient informed to take Toprol-XL 25 mg in the morning and 25 mg at night. She will notify clinic if palpitations return. She states she has popping in her ears and doesn't know if it is medication-induced. She has appointment with [name] on 5/23.

## 2022-03-17 ENCOUNTER — Ambulatory Visit (INDEPENDENT_AMBULATORY_CARE_PROVIDER_SITE_OTHER): Payer: Medicare PPO | Admitting: Cardiology

## 2022-03-17 ENCOUNTER — Encounter: Payer: Self-pay | Admitting: Cardiology

## 2022-03-17 VITALS — BP 142/68 | HR 56 | Ht 62.0 in | Wt 121.8 lb

## 2022-03-17 DIAGNOSIS — E785 Hyperlipidemia, unspecified: Secondary | ICD-10-CM

## 2022-03-17 DIAGNOSIS — I471 Supraventricular tachycardia: Secondary | ICD-10-CM

## 2022-03-17 DIAGNOSIS — I10 Essential (primary) hypertension: Secondary | ICD-10-CM | POA: Diagnosis not present

## 2022-03-17 DIAGNOSIS — R5383 Other fatigue: Secondary | ICD-10-CM

## 2022-03-17 DIAGNOSIS — R030 Elevated blood-pressure reading, without diagnosis of hypertension: Secondary | ICD-10-CM

## 2022-03-17 MED ORDER — METOPROLOL SUCCINATE ER 50 MG PO TB24: ORAL_TABLET | ORAL | 2 refills | Status: DC

## 2022-03-17 NOTE — Telephone Encounter (Signed)
Patient had an appointment with Dr Ellyn Hack 03/17/22- to discuss issue.

## 2022-03-17 NOTE — Progress Notes (Signed)
Primary Care Provider: Velna Hatchet, MD Cardiologist: [name], MD Electrophysiologist: None  Clinic Note: Chief Complaint  Patient presents with   Follow-up    Discussed side effects of medicines-concern for tinnitus on higher dose of beta-blocker.   Palpitations    Short bursts of PAT, PVCs etc. pretty well controlled on beta-blocker.  Unfortunately with higher dose she started noticing tinnitus earlier this year.    ===================================  ASSESSMENT/PLAN   Problem List Items Addressed This Visit       Cardiology Problems   Hyperlipidemia with target LDL less than 100 (Chronic)    Would like to see her lipids better controlled.  She does not want take statin so we will risk stratify with a coronary calcium score.  Would still like to see LDL less than 130 and preferably less than 100.  Labs appear to be rechecked with PCP soon so we can see with lifestyle modification how she does.  I think is not unreasonable to try a low-dose of rosuvastatin if her LDL is still hovering around 130.  Would like to see it less than 100.      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  She also has PVCs. Has been doing very well.  We increased Toprol dose last visit because of more prominent palpitations.  However she has been troubled with tendinitis.  At this point I think we can just reduce to 25 mg twice daily Toprol and see how she does.  Tinnitus is an unusual side effect of beta-blocker, I think it may very well be related to her sinus headaches etc.  Continue to avoid triggers such as decongestants, stimulants, caffeine and maintain adequate hydration.      Relevant Medications   metoprolol succinate (TOPROL-XL) 50 MG 24 hr tablet    Other Relevant Orders   EKG 12-Lead (Completed)   Hypertension (Chronic)    BP is little up today which is unusual for her.  With [place] reducing her beta-blocker dose need to monitor this to see if she is going to have higher blood pressures. Would probably avoid diuretics to avoid dehydration but may exacerbate palpitations.    Would recommend that we will start off with ARB over a side because of concerns for possible cough      Relevant Medications   metoprolol succinate (TOPROL-XL) 50 MG 24 hr tablet   Other Relevant Orders   EKG 12-Lead (Completed)   CT CARDIAC SCORING (SELF PAY ONLY) (Completed)     Other   Borderline hypertension (Chronic)   Relevant Orders   CT CARDIAC SCORING (SELF PAY ONLY) (Completed)   Fatigue    This does not seem to be much of an issue now.       ===================================  HPI:    Morgan [name] is a 71 y.o. female with a PMH notable for AVNRT-SVT, and PVCs (well controlled with beta-blocker) along with mild hypertension hyperlipidemia who presents today for Annual follow-up with more frequent palpitations.Morgan [name] was last seen on January 27, 2021 -> noted deconditioning, had gained some weight.  Little bit more fatigued.  We checked TSH and CBC along with CMP and lipid panel.  Her palpitations/SVT spells and PVCs were pretty well controlled on  Toprol doing better on the long-acting versus short acting beta-blocker.  Had not used any of the short acting PRN dose.  Continue to recommend adequate hydration.  Lipids not well controlled.  Asked her to increase her fish oil tablets change her diet and reassess lipids in 6 months.  TSH was normal.  Recent Hospitalizations:  None  Contacted the office for refill of medications.  Was taking 50 mg Toprol in the morning and 25 mg in the evening.  About a month later, she noted concerns of tinnitus that she wondered if was related to beta-blocker. => Recommended cutting her dose down to 1/2  tablet twice daily or otherwise consider different beta-blocker.  Reviewed  CV studies:    The following studies were reviewed today: (if available, images/films reviewed: From Epic Chart or Care Everywhere) Thyroid ultrasound 08/11/2021: Bilateral thyroid nodules do not meet criteria for FNA or imaging follow-up.: No cardiac studies.   Interval History:   Morgan [name] returns here today to discuss her beta-blocker issues.  She is still having issues with the left ear tinnitus symptoms.  It is getting better though and less bothersome. She is not having any further palpitations.  She is wondering if the tenderness could be because of headache and sinusitis. She also was worried about her baseline cardiovascular risk.  LDL was 129 as of October and PCP wanted to start her on rosuvastatin.  She was looking to do so until she discussed it with me. She has not had any chest pain or pressure with rest or exertion.  No PND, orthopnea or edema.  No syncope or near syncope.  Palpitations are pretty well controlled now that she is taking a beta-blocker.  No TIA/amaurosis fugax or claudication.   REVIEWED OF SYSTEMS   Review of Systems  Constitutional:  Negative for malaise/fatigue and weight loss.  HENT:  Positive for congestion and sinus pain. Negative for hearing loss (Left ear tinnitus).   Respiratory:  Negative for cough and shortness of breath.   Gastrointestinal:  Negative for blood in stool and melena.  Genitourinary:  Negative for hematuria.  Musculoskeletal:  Negative for falls and joint pain.  Neurological:  Positive for dizziness and headaches.  Psychiatric/Behavioral:  Negative for depression and memory loss. The patient is nervous/anxious (A little nervous about her tinnitus.). The patient does not have insomnia.    I have reviewed and (if needed) personally updated the patient's problem list, medications, allergies, past medical and surgical history, social and family history.    PAST MEDICAL HISTORY   Past Medical History:  Diagnosis Date   Basal cell carcinoma    Borderline hypertension    H/O echocardiogram 2011   Normal ejection fraction greater than 50%. No wall motion abnormalities no valvular lesions.   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   calcium carbonate (OS-CAL) 600 MG TABS tablet Take 600 mg by mouth daily.   cholecalciferol (VITAMIN D) 1000 UNITS tablet Take 1,000 Units  by mouth daily.   Cyanocobalamin (B-12 PO) Take 1 tablet by mouth daily.   estradiol (ESTRACE) 1 MG tablet Take 1 tablet by mouth daily.   Multiple Vitamin (MULTIVITAMIN) tablet Take 1 tablet by mouth daily.   nitrofurantoin, macrocrystal-monohydrate, (MACROBID) 100 MG capsule Take 100 mg by mouth See admin instructions. Take as needed/as directed for flares   Omega-3 Fatty Acids (FISH OIL) 1200 MG CAPS Take 1 capsule (1,200 mg total) by mouth daily.   progesterone (PROMETRIUM) 200 MG capsule Take 200 mg by mouth every evening.    triamcinolone cream (KENALOG) 0.1 %    vitamin E 400 UNIT capsule Take 400 Units by mouth daily.   [DISCONTINUED] metoprolol succinate (TOPROL-XL) 50 MG 24 hr tablet TAKE 1 TABLET(50 MG) BY MOUTH DAILY    Allergies  Allergen Reactions   Fish Allergy Anaphylaxis, Hives and Itching    [place] Fish only    SOCIAL HISTORY/FAMILY HISTORY   Reviewed in Epic:  Pertinent findings:  Social History   Tobacco Use   Smoking status: Never   Smokeless tobacco: Never   Vaping Use   Vaping Use: Never used  Substance Use Topics   Alcohol use: Yes    Comment: Socially   Drug use: Never   Social History   Social History Narrative   Now divorced, mother of 3.   Actively exercising, enjoys running and doing aerobics at the gym..   She exercises in the gym and also runs outside.   Does not smoke, or drink alcohol.      Has recently moved into [place] having finalized her divorce.  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      OBJCTIVE -PE, EKG, labs   Wt Readings from Last 3 Encounters:  03/17/22 121 lb 12.8 oz (55.2 kg)  01/27/21 120 lb (54.4 kg)  01/15/20 118 lb (53.5 kg)    Physical Exam: BP (!) 142/68   Pulse (!) 56   Ht '5\' 2"'$  (1.575 m)   Wt 121 lb 12.8 oz (55.2 kg)   SpO2 96%   BMI 22.28 kg/m  Physical Exam Vitals reviewed.  Constitutional:      General: She is not in acute distress.    Appearance: Normal appearance. She is normal weight. She is not ill-appearing (Healthy-appearing.  Well-groomed.) or toxic-appearing.  HENT:     Head: Normocephalic and atraumatic.  Neck:     Vascular: No carotid bruit.  Cardiovascular:     Rate and Rhythm: Regular rhythm. Bradycardia present.     Pulses: Normal pulses.     Heart sounds: Normal heart sounds. No murmur heard.    No friction rub. No gallop.  Pulmonary:     Effort: Pulmonary effort is normal. No respiratory distress.     Breath sounds: Normal breath sounds. No wheezing, rhonchi or rales.  Chest:     Chest wall: No tenderness.  Musculoskeletal:        General: No swelling. Normal range of motion.     Cervical back: Normal range of motion and neck supple.  Skin:    General: Skin is warm and dry.  Neurological:     General: No focal deficit present.     Mental Status: She is alert and oriented to person, place, and time.     Gait: Gait normal.  Psychiatric:        Mood and Affect: Mood normal.  Behavior: Behavior  normal.        Thought Content: Thought content normal.        Judgment: Judgment normal.     Adult ECG Report  Rate: 56 ;  Rhythm: normal sinus rhythm and Cannot exclude septal MI, age-indeterminate. ;   Narrative Interpretation: Stable  Recent Labs: Due for follow-up with PCP soon 08/04/2021: TC 239, TG 207, HDL 69, LDL 129.  TSH 1.4. Lab Results  Component Value Date   CHOL 229 (H) 01/16/2021   HDL 70 01/16/2021   LDLCALC 135 (H) 01/16/2021   TRIG 139 01/16/2021   CHOLHDL 3.3 01/16/2021   Lab Results  Component Value Date   CREATININE 1.05 (H) 01/15/2020   BUN 16 01/15/2020   NA 140 01/15/2020   K 4.7 01/15/2020   CL 102 01/15/2020   CO2 24 01/15/2020      Latest Ref Rng & Units 01/15/2020    9:05 AM 04/12/2018    3:54 PM  CBC  WBC 3.4 - 10.8 x10E3/uL 5.7  15.2   Hemoglobin 11.1 - 15.9 g/dL 13.3  12.3   Hematocrit 34.0 - 46.6 % 40.7  37.0   Platelets 150 - 450 x10E3/uL 276  272     No results found for: "HGBA1C" Lab Results  Component Value Date   TSH 3.210 01/15/2020    ==================================================  COVID-19 Education: The signs and symptoms of COVID-19 were discussed with the patient and how to seek care for testing (follow up with PCP or arrange E-visit).    I spent a total of 32 minutes with the patient spent in direct patient consultation.  Additional time spent with chart review  / charting (studies, outside notes, etc): 10 min Total Time: 42 min  Current medicines are reviewed at length with the patient today.  (+/- concerns) n/a  This visit occurred during the SARS-CoV-2 public health emergency.  Safety protocols were in place, including screening questions prior to the visit, additional usage of staff PPE, and extensive cleaning of exam room while observing appropriate contact time as indicated for disinfecting solutions.  Notice: This dictation was prepared with Dragon dictation along with smart phrase technology. Any  transcriptional errors that result from this process are unintentional and may not be corrected upon review.  Studies Ordered:   Orders Placed This Encounter  Procedures   CT CARDIAC SCORING (SELF PAY ONLY)   EKG 12-Lead   Meds ordered this encounter  Medications   metoprolol succinate (TOPROL-XL) 50 MG 24 hr tablet    Sig: TAKE 25 mg ( 1/2 tablet of 50 MG) BY MOUTH  TWICE A DAILY, MAY TAKE AN ADDITIONAL 25 MG IN THE EVENING FOR A TOTAL OF 2 DAY  AS NEEDED FOR  INCREASE HEARTRATE    Dispense:  90 tablet    Refill:  2    Patient Instructions / Medication Changes & Studies & Tests Ordered   Patient Instructions  Medication Instructions:    Continue taking Toprol XL( metoprolol succinate) - except change to 25 mg twice a day   If you have a breakthrough of extra heart beats increase up to 50 mg for 2 days  ( the evening dose only )   *If you need a refill on your cardiac medications before your next appointment, please call your pharmacy*   Lab Work:  Per PCP   Testing/Procedures:  CT coronary calcium score.   Test locations:  [facility] ([address] [place],  [address]) [facility] ([place], [place] [address])   This is $99 out of pocket.  Your next appointment:   7 month(s)  The format for your next appointment:   In Person  Provider:   [name], MD    Important Information About Sugar           [name], M.D., M.S. Interventional Cardiologist   Pager # [phone] Phone # [phone] [address] [place], [place] [address]   Thank you for choosing [facility] at [facility]!!

## 2022-03-17 NOTE — Patient Instructions (Signed)
Medication Instructions:    Continue taking Toprol XL( metoprolol succinate) - except change to 25 mg twice a day   If you have a breakthrough of extra heart beats increase up to 50 mg for 2 days  ( the evening dose only )   *If you need a refill on your cardiac medications before your next appointment, please call your pharmacy*   Lab Work:     Testing/Procedures:  CT coronary calcium score.   Test locations:  [place] [facility] ([address] [place], [place] [address]) [facility]  ([address] [place], [place] [address])   This is $99 out of pocket.   Coronary CalciumScan A coronary calcium scan is an imaging test used to look for deposits of calcium and other fatty materials (plaques) in the inner lining of the blood vessels of the heart (coronary arteries). These deposits of calcium and plaques can partly clog and narrow the coronary arteries without producing any symptoms or warning signs. This puts a person at risk for a heart attack. This test can detect these deposits before symptoms develop. Tell a health care provider about: Any allergies you have. All medicines you are taking, including vitamins, herbs, eye drops, creams, and over-the-counter medicines. Any problems you or family members have had with anesthetic medicines. Any blood disorders you have. Any surgeries you have had. Any medical conditions you have. Whether you are pregnant or may be pregnant. What are the risks? Generally, this is a safe procedure. However, problems may occur, including: Harm to a pregnant woman and her unborn baby. This test involves the use of radiation. Radiation exposure can be dangerous to a pregnant woman and her unborn baby. If you are pregnant, you generally should not have this procedure done. Slight increase in the risk of cancer. This is because of the radiation involved in the test. What happens before the procedure? No preparation is needed for this  procedure. What happens during the procedure? You will undress and remove any jewelry around your neck or chest. You will put on a hospital gown. Sticky electrodes will be placed on your chest. The electrodes will be connected to an electrocardiogram (ECG) machine to record a tracing of the electrical activity of your heart. A CT scanner will take pictures of your heart. During this time, you will be asked to lie still and hold your breath for 2-3 seconds while a picture of your heart is being taken. The procedure may vary among health care providers and hospitals. What happens after the procedure? You can get dressed. You can return to your normal activities. It is up to you to get the results of your test. Ask your health care provider, or the department that is doing the test, when your results will be ready. Summary A coronary calcium scan is an imaging test used to look for deposits of calcium and other fatty materials (plaques) in the inner lining of the blood vessels of the heart (coronary arteries). Generally, this is a safe procedure. Tell your health care provider if you are pregnant or may be pregnant. No preparation is needed for this procedure. A CT scanner will take pictures of your heart. You can return to your normal activities after the scan is done. This information is not intended to replace advice given to you by your health care provider. Make sure you discuss any questions you have with your health care provider. Document Released: 04/09/2008 Document Revised: 08/31/2016 Document Reviewed: 08/31/2016 Elsevier Interactive Patient Education  2017 [place], you and your health needs are our priority.  As part of our continuing mission to provide you with exceptional heart care, we have created designated Provider Care Teams.  These Care Teams include your primary Cardiologist (physician) and Advanced Practice Providers (APPs -  Physician  Assistants and Nurse Practitioners) who all work together to provide you with the care you need, when you need it.  We recommend signing up for the patient portal called "MyChart".  Sign up information is provided on this After Visit Summary.  MyChart is used to connect with patients for Virtual Visits (Telemedicine).  Patients are able to view lab/test results, encounter notes, upcoming appointments, etc.  Non-urgent messages can be sent to your provider as well.   To learn more about what you can do with MyChart, go to [email or web address].    Your next appointment:   7 month(s)  The format for your next appointment:   In Person  Provider:   [name], MD    Important Information About Sugar

## 2022-03-24 ENCOUNTER — Ambulatory Visit (HOSPITAL_BASED_OUTPATIENT_CLINIC_OR_DEPARTMENT_OTHER)
Admission: RE | Admit: 2022-03-24 | Discharge: 2022-03-24 | Disposition: A | Discharge status: Home / Self Care | Payer: Medicare PPO | Source: Ambulatory Visit | Attending: Cardiology | Admitting: Cardiology

## 2022-03-24 DIAGNOSIS — R599 Enlarged lymph nodes, unspecified: Secondary | ICD-10-CM | POA: Diagnosis not present

## 2022-03-24 DIAGNOSIS — Z1152 Encounter for screening for COVID-19: Secondary | ICD-10-CM | POA: Diagnosis not present

## 2022-03-24 DIAGNOSIS — R0981 Nasal congestion: Secondary | ICD-10-CM | POA: Diagnosis not present

## 2022-03-24 DIAGNOSIS — I10 Essential (primary) hypertension: Secondary | ICD-10-CM | POA: Insufficient documentation

## 2022-03-24 DIAGNOSIS — H9312 Tinnitus, left ear: Secondary | ICD-10-CM | POA: Diagnosis not present

## 2022-03-24 DIAGNOSIS — R030 Elevated blood-pressure reading, without diagnosis of hypertension: Secondary | ICD-10-CM | POA: Insufficient documentation

## 2022-03-24 DIAGNOSIS — H9202 Otalgia, left ear: Secondary | ICD-10-CM | POA: Diagnosis not present

## 2022-03-24 DIAGNOSIS — E785 Hyperlipidemia, unspecified: Secondary | ICD-10-CM | POA: Insufficient documentation

## 2022-03-30 ENCOUNTER — Telehealth: Payer: Self-pay | Admitting: Cardiology

## 2022-03-30 NOTE — Telephone Encounter (Signed)
Patient was calling in bout her results. Please advise  

## 2022-03-30 NOTE — Telephone Encounter (Signed)
Returned call to patient and advised her of the following regarding her calcium score:   [name], MD  03/25/2022  1:21 AM EDT     Coronary Calcium Score 0-low risk.   Great news.  This would mean that there is likely not a lot of coronary artery disease present.   [name], MD   Spoke with patient regarding the following results. Patient made aware and patient verbalized understanding.   Patient states that she was told at one time she may need to start Crestor due to her elevated LDL- patient would like to know if [name] recommends anything since her calcium score was 0. Advised her I would forward message over to him for him to review and advise. Patient verbalized understanding.

## 2022-03-31 NOTE — Telephone Encounter (Signed)
I think we would still try to like to see an LDL less than 130 if not close to 100.  She should be due to get labs checked by her PCP this year.  She had  labs done last year in March.  Would like to see with the most recent labs look -> if LDL still pretty well out of control then it may not be a bad idea to be on a low-dose of statin.  [name], MD

## 2022-04-01 NOTE — Telephone Encounter (Signed)
Spoke to patient . She is aware of Dr Ellyn Hack  recommendation.  She states she will be getting  annual labs in the Fall of the year. Will discuss the results at next appointment in  Dec 2023.

## 2022-04-27 ENCOUNTER — Encounter: Payer: Self-pay | Admitting: Cardiology

## 2022-04-27 DIAGNOSIS — H43393 Other vitreous opacities, bilateral: Secondary | ICD-10-CM | POA: Diagnosis not present

## 2022-04-27 DIAGNOSIS — H43813 Vitreous degeneration, bilateral: Secondary | ICD-10-CM | POA: Diagnosis not present

## 2022-04-27 NOTE — Assessment & Plan Note (Signed)
BP is little up today which is unusual for her.  With [place] reducing her beta-blocker dose need to monitor this to see if she is going to have higher blood pressures. Would probably avoid diuretics to avoid dehydration but may exacerbate palpitations.    Would recommend that we will start off with ARB over a side because of concerns for possible cough

## 2022-04-27 NOTE — Assessment & Plan Note (Signed)
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  She also has PVCs. Has been doing very well.  We increased Toprol dose last visit because of more prominent palpitations.  However she has been troubled with tendinitis.  At this point I think we can just reduce to 25 mg twice daily Toprol and see how she does.  Tinnitus is an unusual side effect of beta-blocker, I think it may very well be related to her sinus headaches etc.  Continue to avoid triggers such as decongestants, stimulants, caffeine and maintain adequate hydration.

## 2022-04-27 NOTE — Assessment & Plan Note (Signed)
This does not seem to be much of an issue now.

## 2022-04-27 NOTE — Assessment & Plan Note (Signed)
Would like to see her lipids better controlled.  She does not want take statin so we will risk stratify with a coronary calcium score.  Would still like to see LDL less than 130 and preferably less than 100.  Labs appear to be rechecked with PCP soon so we can see with lifestyle modification how she does.  I think is not unreasonable to try a low-dose of rosuvastatin if her LDL is still hovering around 130.  Would like to see it less than 100.

## 2022-05-07 DIAGNOSIS — L905 Scar conditions and fibrosis of skin: Secondary | ICD-10-CM | POA: Diagnosis not present

## 2022-05-07 DIAGNOSIS — L570 Actinic keratosis: Secondary | ICD-10-CM | POA: Diagnosis not present

## 2022-05-07 DIAGNOSIS — Z85828 Personal history of other malignant neoplasm of skin: Secondary | ICD-10-CM | POA: Diagnosis not present

## 2022-05-07 DIAGNOSIS — L814 Other melanin hyperpigmentation: Secondary | ICD-10-CM | POA: Diagnosis not present

## 2022-05-07 DIAGNOSIS — L280 Lichen simplex chronicus: Secondary | ICD-10-CM | POA: Diagnosis not present

## 2022-05-07 DIAGNOSIS — L821 Other seborrheic keratosis: Secondary | ICD-10-CM | POA: Diagnosis not present

## 2022-05-07 DIAGNOSIS — C44719 Basal cell carcinoma of skin of left lower limb, including hip: Secondary | ICD-10-CM | POA: Diagnosis not present

## 2022-05-07 DIAGNOSIS — L820 Inflamed seborrheic keratosis: Secondary | ICD-10-CM | POA: Diagnosis not present

## 2022-05-07 DIAGNOSIS — D485 Neoplasm of uncertain behavior of skin: Secondary | ICD-10-CM | POA: Diagnosis not present

## 2022-05-08 DIAGNOSIS — H43813 Vitreous degeneration, bilateral: Secondary | ICD-10-CM | POA: Diagnosis not present

## 2022-05-08 DIAGNOSIS — H43393 Other vitreous opacities, bilateral: Secondary | ICD-10-CM | POA: Diagnosis not present

## 2022-06-11 DIAGNOSIS — H43813 Vitreous degeneration, bilateral: Secondary | ICD-10-CM | POA: Diagnosis not present

## 2022-06-16 DIAGNOSIS — H903 Sensorineural hearing loss, bilateral: Secondary | ICD-10-CM | POA: Diagnosis not present

## 2022-06-16 DIAGNOSIS — E049 Nontoxic goiter, unspecified: Secondary | ICD-10-CM | POA: Diagnosis not present

## 2022-06-16 DIAGNOSIS — H9312 Tinnitus, left ear: Secondary | ICD-10-CM | POA: Diagnosis not present

## 2022-06-16 DIAGNOSIS — H6123 Impacted cerumen, bilateral: Secondary | ICD-10-CM | POA: Diagnosis not present

## 2022-06-23 DIAGNOSIS — E042 Nontoxic multinodular goiter: Secondary | ICD-10-CM | POA: Diagnosis not present

## 2022-07-14 DIAGNOSIS — Z85828 Personal history of other malignant neoplasm of skin: Secondary | ICD-10-CM | POA: Diagnosis not present

## 2022-07-14 DIAGNOSIS — L820 Inflamed seborrheic keratosis: Secondary | ICD-10-CM | POA: Diagnosis not present

## 2022-07-21 DIAGNOSIS — R399 Unspecified symptoms and signs involving the genitourinary system: Secondary | ICD-10-CM | POA: Diagnosis not present

## 2022-07-21 DIAGNOSIS — Z1231 Encounter for screening mammogram for malignant neoplasm of breast: Secondary | ICD-10-CM | POA: Diagnosis not present

## 2022-07-21 DIAGNOSIS — N959 Unspecified menopausal and perimenopausal disorder: Secondary | ICD-10-CM | POA: Diagnosis not present

## 2022-07-21 DIAGNOSIS — N951 Menopausal and female climacteric states: Secondary | ICD-10-CM | POA: Diagnosis not present

## 2022-07-21 DIAGNOSIS — Z01419 Encounter for gynecological examination (general) (routine) without abnormal findings: Secondary | ICD-10-CM | POA: Diagnosis not present

## 2022-07-21 DIAGNOSIS — E041 Nontoxic single thyroid nodule: Secondary | ICD-10-CM | POA: Diagnosis not present

## 2022-07-27 DIAGNOSIS — E041 Nontoxic single thyroid nodule: Secondary | ICD-10-CM | POA: Diagnosis not present

## 2022-07-27 DIAGNOSIS — R7989 Other specified abnormal findings of blood chemistry: Secondary | ICD-10-CM | POA: Diagnosis not present

## 2022-07-27 DIAGNOSIS — E785 Hyperlipidemia, unspecified: Secondary | ICD-10-CM | POA: Diagnosis not present

## 2022-08-04 DIAGNOSIS — J329 Chronic sinusitis, unspecified: Secondary | ICD-10-CM | POA: Diagnosis not present

## 2022-08-04 DIAGNOSIS — Z0000 Encounter for general adult medical examination without abnormal findings: Secondary | ICD-10-CM | POA: Diagnosis not present

## 2022-08-04 DIAGNOSIS — E069 Thyroiditis, unspecified: Secondary | ICD-10-CM | POA: Diagnosis not present

## 2022-08-04 DIAGNOSIS — Z1339 Encounter for screening examination for other mental health and behavioral disorders: Secondary | ICD-10-CM | POA: Diagnosis not present

## 2022-08-04 DIAGNOSIS — Z1331 Encounter for screening for depression: Secondary | ICD-10-CM | POA: Diagnosis not present

## 2022-08-04 DIAGNOSIS — H9319 Tinnitus, unspecified ear: Secondary | ICD-10-CM | POA: Diagnosis not present

## 2022-08-04 DIAGNOSIS — E041 Nontoxic single thyroid nodule: Secondary | ICD-10-CM | POA: Diagnosis not present

## 2022-08-04 DIAGNOSIS — E785 Hyperlipidemia, unspecified: Secondary | ICD-10-CM | POA: Diagnosis not present

## 2022-08-04 DIAGNOSIS — Z23 Encounter for immunization: Secondary | ICD-10-CM | POA: Diagnosis not present

## 2022-10-06 ENCOUNTER — Encounter: Payer: Self-pay | Admitting: Cardiology

## 2022-10-06 ENCOUNTER — Ambulatory Visit: Payer: Medicare PPO | Attending: Cardiology | Admitting: Cardiology

## 2022-10-06 VITALS — BP 128/70 | HR 69 | Ht 62.0 in | Wt 121.6 lb

## 2022-10-06 DIAGNOSIS — I10 Essential (primary) hypertension: Secondary | ICD-10-CM

## 2022-10-06 DIAGNOSIS — R5383 Other fatigue: Secondary | ICD-10-CM | POA: Diagnosis not present

## 2022-10-06 DIAGNOSIS — E785 Hyperlipidemia, unspecified: Secondary | ICD-10-CM | POA: Diagnosis not present

## 2022-10-06 DIAGNOSIS — R030 Elevated blood-pressure reading, without diagnosis of hypertension: Secondary | ICD-10-CM

## 2022-10-06 DIAGNOSIS — I471 Supraventricular tachycardia, unspecified: Secondary | ICD-10-CM

## 2022-10-06 NOTE — Progress Notes (Signed)
Primary Care Provider: Velna Hatchet, [place] Cardiologist: [name], MD Electrophysiologist: None  Clinic Note: Chief Complaint  Patient presents with   Follow-up    [name] result-Coronary Calcium Score   Palpitations    Had a little more palpitations with lower dose of Toprol.  Not sure what dose to take.  Was only taking it once a day 25 mg.   ===================================  ASSESSMENT/PLAN   Problem List Items Addressed This Visit       Cardiology Problems   Paroxysmal SVT (supraventricular tachycardia) - Primary (Chronic)    I do not think that she has had any true SVT episodes since reducing the beta-blocker dose, she has had some tachycardia spells versus palpation due to vagal maneuvers.  I think we can see how she does going forward on once daily Toprol 25 mg, but if she ends up taking it PRN dose more often than not, then we will probably go back to 25 mg twice daily.  Stressed importance of staying adequately hydrated. Reinforced vagal maneuver techniques.      Hyperlipidemia with target LDL less than 100 (Chronic)    Lipids not well-controlled.  Thankfully, her Coronary Calcium Score is 0.  As such, we do not necessarily need to be overly aggressive.  She would not want to hold off on treating at this point in time.  I discussed using over-the-counter medication such as co-Q10 and red yeast rice.  We can then reassess after roughly 6 months to see.  I  f her lipids are not controlled, then we would probably want to start some type of statin => probably start with rosuvastatin 10 mg.      Relevant Orders   Lipid panel   Comprehensive metabolic panel   Hypertension (Chronic)    Blood pressure looks good on low-dose Toprol.        Other   Borderline hypertension (Chronic)   Fatigue   Relevant Orders   Comprehensive metabolic panel    ===================================  HPI:    Morgan [name] is a 71 y.o. female with a PMH  notable for history of SVT (AVNRT), and PVCs-both controlled on beta-blockers along with mild HTN and HLD who presents today for 21-monthfollow-up to discuss results Coronary Calcium Score and palpitations..Marland Kitchen Morgan KOMPERDAwas last seen on 03/17/22: She had questions about beta-blocker dosing.  Was unsure if this was potentially causing her to have tinnitus.  Also was concerned about baseline cardiovascular risk with an LDL 129.  PCP had monitor start statin and she was not sure.  No symptoms.  Headache dizziness and tinnitus noted. Ordered Coronary Calcium Score   Recent Hospitalizations:  N/a  Reviewed  CV studies:    The following studies were reviewed today: (if available, images/films reviewed: From Epic Chart or Care Everywhere) Coronary Calcium Score => 0  Interval History:   Morgan TESTreturns today doing okay.  She is was not sure how she should take her beta-blocker dose.  She is currently taking 25 mg and one half of a 50 mg tablet) twice a day.  She is not sure if she needs both doses because she is having some issues of feeling tired and weak.  Not really noticing the palpitations as much but she still has the GERD and tinnitus.  She is concerned about thyroid nodule as well.  We discussed results of her Coronary Calcium Score which was relatively reassuring.  She actually had cut down  to once daily Toprol like we talked about last visit and she says that sometimes near the end of the day she will have Episodes where she will feel her heart rate going a bit fast and she is able to break them with very vagal maneuvers.  Maybe 2 or 3 times a day near the end of the day.  CV Review of Symptoms (Summary):  no chest pain or dyspnea on exertion positive for - palpitations, rapid heart rate, and some dizziness and tinnitus with headache, mild vasovagal type symptoms. negative for - chest pain, edema, orthopnea, paroxysmal nocturnal dyspnea, shortness of breath, or syncope or near  syncope, TIA/amaurosis fugax, medication  REVIEWED OF SYSTEMS   Review of Systems  Constitutional:  Negative for malaise/fatigue and weight loss.  HENT:  Positive for congestion and tinnitus.        Dry mouth; has a thyroid nodule with normal TSH levels.  Gastrointestinal:  Negative for blood in stool and melena.  Genitourinary:  Negative for flank pain and hematuria.  Musculoskeletal:  Negative for joint pain.  Neurological:  Positive for dizziness and headaches.  Psychiatric/Behavioral:  Negative for depression. The patient is nervous/anxious. The patient does not have insomnia.    I have reviewed and (if needed) personally updated the patient's problem list, medications, allergies, past medical and surgical history, social and family history.   PAST MEDICAL HISTORY   Past Medical History:  Diagnosis Date   Basal cell carcinoma    Borderline hypertension    H/O echocardiogram 2011   Normal ejection fraction greater than 50%. No wall motion abnormalities no valvular lesions.   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   calcium carbonate (OS-CAL) 600 MG TABS tablet Take 600 mg by mouth daily.   cholecalciferol (VITAMIN D) 1000 UNITS tablet Take 1,000 Units by mouth daily.   Cyanocobalamin (B-12 PO) Take 1 tablet by mouth daily.   estradiol (ESTRACE) 1 MG tablet Take 1 tablet by mouth daily.   metoprolol succinate (TOPROL-XL) 50 MG 24 hr tablet TAKE 25 mg ( 1/2 tablet of 50 MG) BY MOUTH  TWICE A DAILY, MAY  TAKE AN ADDITIONAL 25 MG IN THE EVENING FOR A TOTAL OF 2 DAY  AS NEEDED FOR  INCREASE HEARTRATE   Multiple Vitamin (MULTIVITAMIN) tablet Take 1 tablet by mouth daily.   nitrofurantoin, macrocrystal-monohydrate, (MACROBID) 100 MG capsule Take 100 mg by mouth See admin instructions. Take as needed/as directed for flares   Omega-3 Fatty Acids (FISH OIL) 1200 MG CAPS Take 1 capsule (1,200 mg total) by mouth daily.   progesterone (PROMETRIUM) 200 MG capsule Take 200 mg by mouth every evening.    progesterone (PROMETRIUM) 200 MG capsule YOU HAVE NEW RXS UNDER DR Opal Sidles CHEN   triamcinolone cream (KENALOG) 0.1 %    vitamin E 400 UNIT capsule Take 400 Units by mouth daily.    Allergies  Allergen Reactions   Fish Allergy Anaphylaxis, Hives and Itching    Perch  Fish only    SOCIAL HISTORY/FAMILY HISTORY   Reviewed in Epic:  Pertinent findings:  Social History   Tobacco Use   Smoking status: Never   Smokeless tobacco: Never  Vaping Use   Vaping Use: Never used  Substance Use Topics   Alcohol use: Yes    Comment: Socially   Drug use: Never   Social History   Social History Narrative   Now divorced, mother of 3.   Actively exercising, enjoys running and doing aerobics at the gym..   She exercises in the gym and also runs outside.   Does not smoke, or drink alcohol.      Has recently moved into [place] having finalized her divorce.  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      OBJCTIVE -PE, EKG, labs   Wt Readings from Last 3 Encounters:  10/06/22 121 lb 9.6 oz (55.2 kg)  03/17/22 121 lb 12.8 oz (55.2 kg)  01/27/21 120 lb (54.4 kg)    Physical Exam: BP 128/70   Pulse 69   Ht '5\' 2"'$  (1.575 m)   Wt 121 lb 9.6 oz (55.2 kg)   SpO2 91%   BMI 22.24 kg/m  Physical Exam Vitals reviewed.  Constitutional:      General: She is not in acute distress.    Appearance: Normal appearance. She is normal weight. She is not ill-appearing  or toxic-appearing.  HENT:     Head: Normocephalic and atraumatic.  Neck:     Thyroid: Thyroid mass (Left lateral) present.     Vascular: No carotid bruit or JVD.  Cardiovascular:     Rate and Rhythm: Normal rate and regular rhythm. No extrasystoles are present.    Chest Wall: PMI is not displaced.     Pulses: Normal pulses.     Heart sounds: S1 normal and S2 normal. No murmur heard.    No friction rub. No gallop.  Pulmonary:     Effort: Pulmonary effort is normal. No respiratory distress.     Breath sounds: Normal breath sounds. No wheezing, rhonchi or rales.  Chest:     Chest wall: No tenderness.  Musculoskeletal:        General: No swelling. Normal range of motion.     Cervical back: Normal range of motion and neck supple.  Skin:    General: Skin is warm and dry.     Coloration: Skin is not jaundiced.  Neurological:     General: No focal deficit present.     Mental Status: She is alert and oriented to person, place, and time.     Gait: Gait normal.  Psychiatric:        Mood and Affect: Mood normal.        Thought Content: Thought content normal.        Judgment: Judgment normal.     Adult ECG Report N/A Recent Labs:   TC 246, TG 143, HDL 64, LDL 153. Lab Results  Component Value Date   CHOL 229 (H) 01/16/2021   HDL 70 01/16/2021   LDLCALC 135 (H) 01/16/2021   TRIG 139 01/16/2021   CHOLHDL 3.3 01/16/2021   Lab Results  Component Value Date   CREATININE 1.05 (H) 01/15/2020   BUN 16 01/15/2020   NA 140 01/15/2020   K 4.7 01/15/2020   CL 102 01/15/2020   CO2 24 01/15/2020      Latest Ref Rng & Units 01/15/2020  9:05 AM 04/12/2018    3:54 PM  CBC  WBC 3.4 - 10.8 x10E3/uL 5.7  15.2   Hemoglobin 11.1 - 15.9 g/dL 13.3  12.3   Hematocrit 34.0 - 46.6 % 40.7  37.0   Platelets 150 - 450 x10E3/uL 276  272     No results found for: "HGBA1C" Lab Results  Component Value Date   TSH 3.210 01/15/2020    ================================================== I  spent a total of 34 minutes with the patient spent in direct patient consultation.  She had lots of questions. Additional time spent with chart review  / charting (studies, outside notes, etc): 12 min Total Time: 46 min  Current medicines are reviewed at length with the patient today.  (+/- concerns) none  Notice: This dictation was prepared with Dragon dictation along with smart phrase technology. Any transcriptional errors that result from this process are unintentional and may not be corrected upon review.  Studies Ordered:   Orders Placed This Encounter  Procedures   Lipid panel   Comprehensive metabolic panel   No orders of the defined types were placed in this encounter.   Patient Instructions / Medication Changes & Studies & Tests Ordered   Patient Instructions  Medication Instructions:     You can try  Red yeast rice and /or CoQ10 for your cholesterol    If have  spell  that you you need to do the vagal maneuvers  take an additional 25 mg of Toprol ( metoprolol succinate)  if you  3 or more episodes in a week period then increase Metoprolol succinate  25 mg to twice a day   Call and informed the office so we can increase quantity of medication and send a you prescription into the pharmacy .   *If you need a refill on your cardiac medications before your next appointment, please call your pharmacy*   Lab Work:in 6 month  fasting Lipid CMP If you have labs (blood work) drawn today and your tests are completely normal, you will receive your results only by: [place] (if you have MyChart) OR A paper copy in the mail If you have any lab [name] that is abnormal or we need to change your treatment, we will call you to review the results.   Testing/Procedures:  Not needed  Follow-Up: At [facility], you and your health needs are our priority.  As part of our continuing mission to provide you with exceptional heart care, we have created designated Provider Care  Teams.  These Care Teams include your primary Cardiologist (physician) and Advanced Practice Providers (APPs -  Physician Assistants and Nurse Practitioners) who all work together to provide you with the care you need, when you need it.     Your next appointment:   6 month(s)  The format for your next appointment:   In Person  Provider:   [name], MD    Other Instructions   You can try  Red yeast rice and /or CoQ10 for your cholesterol    If have  spell  that you you need to do the vagal maneuvers  take an additional 25 mg of Toprol ( metoprolol succinate)  if you  3 or more episodes in a week period then increase Metoprolol succinate  25 mg to twice a day   Call and informed the office so we can increase quantity of medication and send a you prescription into the pharmacy .        Leonie Green.  Ellyn Hack, MD, MS [name], M.D., M.S. Interventional Cardiologist  [place]  Pager # [phone] Phone # [phone] [address] [place], [place] [address]   Thank you for choosing [facility]!!

## 2022-10-06 NOTE — Patient Instructions (Addendum)
Medication Instructions:     You can try  Red yeast rice and /or CoQ10 for your cholesterol    If have  spell  that you you need to do the vagal maneuvers  take an additional 25 mg of Toprol ( metoprolol succinate)  if you  3 or more episodes in a week period then increase Metoprolol succinate  25 mg to twice a day   Call and informed the office so we can increase quantity of medication and send a you prescription into the pharmacy .   *If you need a refill on your cardiac medications before your next appointment, please call your pharmacy*   Lab Work:in 6 month  fasting Lipid CMP If you have labs (blood work) drawn today and your tests are completely normal, you will receive your results only by: [place] (if you have MyChart) OR A paper copy in the mail If you have any lab test that is abnormal or we need to change your treatment, we will call you to review the results.   Testing/Procedures:  Not needed  Follow-Up: At [facility], you and your health needs are our priority.  As part of our continuing mission to provide you with exceptional heart care, we have created designated Provider Care Teams.  These Care Teams include your primary Cardiologist (physician) and Advanced Practice Providers (APPs -  Physician Assistants and Nurse Practitioners) who all work together to provide you with the care you need, when you need it.     Your next appointment:   6 month(s)  The format for your next appointment:   In Person  Provider:   [name], MD    Other Instructions   You can try  Red yeast rice and /or CoQ10 for your cholesterol    If have  spell  that you you need to do the vagal maneuvers  take an additional 25 mg of Toprol ( metoprolol succinate)  if you  3 or more episodes in a week period then increase Metoprolol succinate  25 mg to twice a day   Call and informed the office so we can increase quantity of medication and send a you prescription into the  pharmacy .

## 2022-10-10 ENCOUNTER — Encounter: Payer: Self-pay | Admitting: Cardiology

## 2022-10-10 NOTE — Assessment & Plan Note (Signed)
I do not think that she has had any true SVT episodes since reducing the beta-blocker dose, she has had some tachycardia spells versus palpation due to vagal maneuvers.  I think we can see how she does going forward on once daily Toprol 25 mg, but if she ends up taking it PRN dose more often than not, then we will probably go back to 25 mg twice daily.  Stressed importance of staying adequately hydrated. Reinforced vagal maneuver techniques.

## 2022-10-10 NOTE — Assessment & Plan Note (Signed)
Blood pressure looks good on low-dose Toprol.

## 2022-10-10 NOTE — Assessment & Plan Note (Signed)
Lipids not well-controlled.  Thankfully, her Coronary Calcium Score is 0.  As such, we do not necessarily need to be overly aggressive.  She would not want to hold off on treating at this point in time.  I discussed using over-the-counter medication such as co-Q10 and red yeast rice.  We can then reassess after roughly 6 months to see.  I  f her lipids are not controlled, then we would probably want to start some type of statin => probably start with rosuvastatin 10 mg.

## 2022-11-04 ENCOUNTER — Telehealth: Payer: Self-pay | Admitting: Cardiology

## 2022-11-04 NOTE — Telephone Encounter (Signed)
Patient has questions about co-Q10 and red yeast rice. Wants to know if there was a certain type to use and MG and if she could take them together. Please call back to discuss.

## 2022-11-04 NOTE — Telephone Encounter (Signed)
Hyperlipidemia with target LDL less than 100 (Chronic)       Lipids not well-controlled.  Thankfully, her Coronary Calcium Score is 0.  As such, we do not necessarily need to be overly aggressive.  She would not want to hold off on treating at this point in time.  I discussed using over-the-counter medication such as co-Q10 and red yeast rice.  We can then reassess after roughly 6 months to see.  I   f her lipids are not controlled, then we would probably want to start some type of statin => probably start with rosuvastatin 10 mg.     Per Dr. Darcus Pester note as above- will forward to PharmD to advise on dosages of medications.

## 2022-11-05 NOTE — Telephone Encounter (Signed)
Attempted to call patient, left message for patient to call back to office.   

## 2022-11-05 NOTE — Telephone Encounter (Signed)
Patient is returning call. She wants to know if there is a brand name and the milligrams she needs to take of the medications the doctor wants her to take.

## 2022-11-05 NOTE — Telephone Encounter (Signed)
Spoke to patient Pharm D's advice given.

## 2022-11-05 NOTE — Telephone Encounter (Signed)
Red yeast rice: '600mg'$  daily. Co Q 10: '100mg'$  daily

## 2022-11-11 DIAGNOSIS — I788 Other diseases of capillaries: Secondary | ICD-10-CM | POA: Diagnosis not present

## 2022-11-11 DIAGNOSIS — L438 Other lichen planus: Secondary | ICD-10-CM | POA: Diagnosis not present

## 2022-11-11 DIAGNOSIS — D485 Neoplasm of uncertain behavior of skin: Secondary | ICD-10-CM | POA: Diagnosis not present

## 2022-11-11 DIAGNOSIS — L570 Actinic keratosis: Secondary | ICD-10-CM | POA: Diagnosis not present

## 2022-11-11 DIAGNOSIS — L814 Other melanin hyperpigmentation: Secondary | ICD-10-CM | POA: Diagnosis not present

## 2022-11-11 DIAGNOSIS — Z85828 Personal history of other malignant neoplasm of skin: Secondary | ICD-10-CM | POA: Diagnosis not present

## 2022-11-11 DIAGNOSIS — L820 Inflamed seborrheic keratosis: Secondary | ICD-10-CM | POA: Diagnosis not present

## 2022-11-11 DIAGNOSIS — L821 Other seborrheic keratosis: Secondary | ICD-10-CM | POA: Diagnosis not present

## 2023-02-02 ENCOUNTER — Telehealth: Payer: Self-pay | Admitting: Cardiology

## 2023-02-02 MED ORDER — METOPROLOL SUCCINATE ER 25 MG PO TB24: 25.0000 mg | ORAL_TABLET | Freq: Two times a day (BID) | ORAL | 2 refills | Status: DC

## 2023-02-02 NOTE — Telephone Encounter (Signed)
Called pt to let her know the updated prescription has been sent in for her. No answer, left message on machine.

## 2023-02-02 NOTE — Telephone Encounter (Signed)
Pt c/o medication issue:  1. Name of Medication:   metoprolol succinate (TOPROL-XL) 50 MG 24 hr tablet    2. How are you currently taking this medication (dosage and times per day)? TAKE 25 mg ( 1/2 tablet of 50 MG) BY MOUTH TWICE A DAILY, MAY TAKE AN ADDITIONAL 25 MG IN THE EVENING FOR A TOTAL OF 2 DAY AS NEEDED FOR INCREASE HEARTRATE   3. Are you having a reaction (difficulty breathing--STAT)? No  4. What is your medication issue? Pt stated she needs a new script sent over to pharmacy for 25mg  tablet instead of 50mg  so that she doesn't have to break the tablet in half. Please advise.

## 2023-02-12 DIAGNOSIS — R350 Frequency of micturition: Secondary | ICD-10-CM | POA: Diagnosis not present

## 2023-02-23 DIAGNOSIS — L905 Scar conditions and fibrosis of skin: Secondary | ICD-10-CM | POA: Diagnosis not present

## 2023-02-23 DIAGNOSIS — L820 Inflamed seborrheic keratosis: Secondary | ICD-10-CM | POA: Diagnosis not present

## 2023-02-23 DIAGNOSIS — L821 Other seborrheic keratosis: Secondary | ICD-10-CM | POA: Diagnosis not present

## 2023-02-23 DIAGNOSIS — L438 Other lichen planus: Secondary | ICD-10-CM | POA: Diagnosis not present

## 2023-02-23 DIAGNOSIS — D485 Neoplasm of uncertain behavior of skin: Secondary | ICD-10-CM | POA: Diagnosis not present

## 2023-02-23 DIAGNOSIS — L603 Nail dystrophy: Secondary | ICD-10-CM | POA: Diagnosis not present

## 2023-02-23 DIAGNOSIS — Z85828 Personal history of other malignant neoplasm of skin: Secondary | ICD-10-CM | POA: Diagnosis not present

## 2023-04-02 ENCOUNTER — Other Ambulatory Visit: Payer: Self-pay | Admitting: *Deleted

## 2023-04-02 MED ORDER — METOPROLOL SUCCINATE ER 25 MG PO TB24: 25.0000 mg | ORAL_TABLET | Freq: Two times a day (BID) | ORAL | 0 refills | Status: DC

## 2023-04-06 ENCOUNTER — Ambulatory Visit: Payer: Medicare PPO | Admitting: Podiatry

## 2023-04-06 DIAGNOSIS — E785 Hyperlipidemia, unspecified: Secondary | ICD-10-CM | POA: Diagnosis not present

## 2023-04-06 DIAGNOSIS — L603 Nail dystrophy: Secondary | ICD-10-CM | POA: Diagnosis not present

## 2023-04-06 DIAGNOSIS — B351 Tinea unguium: Secondary | ICD-10-CM | POA: Diagnosis not present

## 2023-04-06 DIAGNOSIS — R5383 Other fatigue: Secondary | ICD-10-CM | POA: Diagnosis not present

## 2023-04-06 NOTE — Progress Notes (Signed)
  Subjective:  Patient ID: Morgan [name], female    DOB: Apr 08, 1951,  MRN: [id number]  Chief Complaint  Patient presents with   Nail Problem    Patient would like to remove the right foot hallux toenail the remaining part. Patient has had issues with the toenail has been an issue for the last year or so. She would get pedi and would cover up the toenail. Patient states that it hasn't been painful. Recently bumped the toenail on a stool and it hurt badly. Treatment has been soaking, with epsom salt, salt water and vinger. Also she was given antibiotics as well.     72 y.o. female presents with the above complaint. History confirmed with patient.   Objective:  Physical Exam: warm, good capillary refill, no trophic changes or ulcerative lesions, normal DP and PT pulses, normal sensory exam, and dystrophic hallux nail with discoloration Assessment:   1. Nail dystrophy      Plan:  Patient was evaluated and treated and all questions answered.  Discussed possible etiologies, we discussed onychomycosis is a possibility.  She would like to see if the nail can regrow but I think this is reasonable to pursue this before permanent matricectomy.  Following verbal consent and digital block with lidocaine and Marcaine the hallux of the right foot was prepped with Betadine and the nail plate was avulsed.  This was sent for culture and analysis.  We discussed treating this with terbinafine if it returns with fungus.  Will allow the nail to continue to grow and return, we discussed the possibility of permanent dystrophy or lack of growth of the nail plate.  If it returns and continues to give problems permanent matricectomy could be undertaken.  No follow-ups on file.

## 2023-04-06 NOTE — Patient Instructions (Signed)

## 2023-04-07 ENCOUNTER — Encounter: Payer: Self-pay | Admitting: Podiatry

## 2023-04-07 LAB — COMPREHENSIVE METABOLIC PANEL
ALT: 14 IU/L (ref 0–32)
AST: 21 IU/L (ref 0–40)
Albumin/Globulin Ratio: 1.5
Albumin: 4 g/dL (ref 3.8–4.8)
Alkaline Phosphatase: 76 IU/L (ref 44–121)
BUN/Creatinine Ratio: 12 (ref 12–28)
BUN: 12 mg/dL (ref 8–27)
Bilirubin Total: 0.2 mg/dL (ref 0.0–1.2)
CO2: 24 mmol/L (ref 20–29)
Calcium: 9.4 mg/dL (ref 8.7–10.3)
Chloride: 103 mmol/L (ref 96–106)
Creatinine, Ser: 0.97 mg/dL (ref 0.57–1.00)
Globulin, Total: 2.7 g/dL (ref 1.5–4.5)
Glucose: 90 mg/dL (ref 70–99)
Potassium: 4.6 mmol/L (ref 3.5–5.2)
Sodium: 138 mmol/L (ref 134–144)
Total Protein: 6.7 g/dL (ref 6.0–8.5)
eGFR: 62 mL/min/{1.73_m2} (ref >59)

## 2023-04-07 LAB — LIPID PANEL
Chol/HDL Ratio: 3.8 ratio (ref 0.0–4.4)
Cholesterol, Total: 241 mg/dL — ABNORMAL HIGH (ref 100–199)
HDL: 64 mg/dL (ref >39)
LDL Chol Calc (NIH): 128 mg/dL — ABNORMAL HIGH (ref 0–99)
Triglycerides: 278 mg/dL — ABNORMAL HIGH (ref 0–149)
VLDL Cholesterol Cal: 49 mg/dL — ABNORMAL HIGH (ref 5–40)

## 2023-04-20 ENCOUNTER — Ambulatory Visit: Payer: Medicare PPO | Attending: Cardiology | Admitting: Cardiology

## 2023-04-20 ENCOUNTER — Encounter: Payer: Self-pay | Admitting: Cardiology

## 2023-04-20 ENCOUNTER — Other Ambulatory Visit: Payer: Self-pay

## 2023-04-20 VITALS — BP 138/78 | HR 67 | Ht 62.0 in | Wt 120.6 lb

## 2023-04-20 DIAGNOSIS — I10 Essential (primary) hypertension: Secondary | ICD-10-CM

## 2023-04-20 DIAGNOSIS — E785 Hyperlipidemia, unspecified: Secondary | ICD-10-CM | POA: Diagnosis not present

## 2023-04-20 DIAGNOSIS — R030 Elevated blood-pressure reading, without diagnosis of hypertension: Secondary | ICD-10-CM | POA: Diagnosis not present

## 2023-04-20 DIAGNOSIS — E063 Autoimmune thyroiditis: Secondary | ICD-10-CM

## 2023-04-20 DIAGNOSIS — I471 Supraventricular tachycardia, unspecified: Secondary | ICD-10-CM

## 2023-04-20 NOTE — Patient Instructions (Signed)

## 2023-04-20 NOTE — Progress Notes (Signed)
Primary Care Provider: Alysia Penna, MD [facility] Cardiologist: Bryan Lemma, MD Electrophysiologist: None  Clinic Note: Chief Complaint  Patient presents with   Follow-up    Doing well.  Anxious about taking statin.   Palpitations    Pretty well-controlled    ===================================  ASSESSMENT/PLAN   Problem List Items Addressed This Visit       Cardiology Problems   Paroxysmal SVT (supraventricular tachycardia) - Primary (Chronic)    Frequent PACs as well as runs of PSVT.  Symptoms seem to be pretty well-controlled on low-dose Toprol.  She has not had to use additional doses of.  Reiterated importance of staying adequately hydrated, vagal maneuvers and avoiding triggers.      RESOLVED: Hypertension (Chronic)   Hyperlipidemia with target LDL less than 100 (Chronic)    Unfortunately, her lipids did not really get any better.  Mildly she actually tried the red yeast rice or co-Q10, and has not been doing as well with her exercise over the winter.  She is very reluctant to start statins and wants to try it again.  Based on Coronary Calcium Score being 0 we do have a little bit luxury of time.  However, I would not wait too long to consider treatment.  She was very reluctant to start rosuvastatin so we will hold off for now and discuss next visit.        Other   History of Hashimoto's thyroiditis (Chronic)    PCP follow-up thyroid levels.      Borderline hypertension (Chronic)    Borderline blood pressure today.  Will continue to monitor, and pressures to start increase may want to consider either ARB or low-dose diuretic.       ===================================  HPI:    @HCNARRATIVEHISTORYBEGIN @ Morgan [name] is a 72 y.o. female with a PMH notable for PSVT (AVNRT) & symptomatic PVCs both controlled on beta-blocker along with HTN and HLD who presents today for 89-month follow-up at the request of Alysia Penna, MD.  Morgan [name]  was last seen on October 06, 2022 as a 62-month follow-up to discuss results of Coronary Calcium Score which was 0.  She was still trying to figure think a beta-blocker.  Was taking Toprol 25 mg twice daily.  Was having some issues with feeling weak and tired, associated reduce it to once a day.  When she reduced was that she did have some breakthrough spells where she was able to break fast heart rates with vagal maneuvers.  More bothered by GERD and tinnitus than palpitations.  Also about thyroid nodule but normal TSH levels. Discussed taking once daily Toprol but taking an extra dose for spells where there is more than 3 episodes of tachycardia or palpitations in 1 week.  Would increase to twice a day for couple weeks. Reiterated vagal maneuvers Discussed trying red yeast rice plus or minus co-Q10 with plans to recheck lipids and chemistries 6 months.  Recent Hospitalizations: None  @HCNARRATIVEHISTORYEND @  Reviewed  CV studies:    The following studies were reviewed today: (if available, images/films reviewed: From Epic Chart or Care Everywhere) None:  Interval History:   Morgan [name] returns here today doing fairly well.  She says that she still has intermittent episodes where she feels her heart rate go up fast more at rest but this is improved since she is gone back to doing some exercise and try to control her health.  Unfortunately over the winter months with colder weather she got  out of the habit of going outside exercise.  She is now to get manage of the improved weather and getting out to exercise.  Has not really required taking the second dose of Toprol in quite some time.  Try to stay adequately hydrated.  CV Review of Symptoms (Summary): no chest pain or dyspnea on exertion positive for - palpitations, rapid heart rate, and these are less frequent and do not last as long.  Still break with vagal maneuvers.  Not enough to take extra doses of Toprol. negative for - edema,  orthopnea, paroxysmal nocturnal dyspnea, shortness of breath, or lightheadedness or dizziness, syncope/near syncope or TIA/amaurosis fugax, claudication  REVIEWED OF SYSTEMS   Review of Systems  Constitutional:  Negative for malaise/fatigue and weight loss.  HENT:  Negative for congestion.   Respiratory:  Negative for cough and shortness of breath.   Gastrointestinal:  Negative for blood in stool and melena.  Genitourinary:  Negative for hematuria.  Musculoskeletal:  Negative for falls and joint pain.  Neurological:  Negative for dizziness and focal weakness.  Psychiatric/Behavioral: Negative.      I have reviewed and (if needed) personally updated the patient's problem list, medications, allergies, past medical and surgical history, social and family history.   PAST MEDICAL HISTORY   Past Medical History:  Diagnosis Date   Basal cell carcinoma    Borderline hypertension    H/O echocardiogram 2011   Normal ejection fraction greater than 50%. No wall motion abnormalities no valvular lesions.   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   calcium carbonate (OS-CAL) 600 MG TABS tablet Take 600 mg by mouth daily.   cholecalciferol (VITAMIN D) 1000 UNITS tablet Take 1,000 Units by mouth daily.   Cyanocobalamin (B-12 PO) Take 1 tablet by mouth daily.   estradiol (ESTRACE) 1 MG tablet Take 1 tablet by mouth daily.   metoprolol succinate (TOPROL XL) 25 MG 24 hr tablet Take 1 tablet (25 mg total) by mouth 2 (two) times daily. MAY TAKE AN ADDITIONAL 25 MG IN THE EVENING FOR A TOTAL OF 2 DAY AS NEEDED FOR INCREASE HEARTRATE    Multiple Vitamin (MULTIVITAMIN) tablet Take 1 tablet by mouth daily.   nitrofurantoin, macrocrystal-monohydrate, (MACROBID) 100 MG capsule Take 100 mg by mouth See admin instructions. Take as needed/as directed for flares   Omega-3 Fatty Acids (FISH OIL) 1200 MG CAPS Take 1 capsule (1,200 mg total) by mouth daily.   progesterone (PROMETRIUM) 200 MG capsule Take 200 mg by mouth every evening.    progesterone (PROMETRIUM) 200 MG capsule YOU HAVE NEW RXS UNDER DR Erskine Squibb CHEN   triamcinolone cream (KENALOG) 0.1 %    vitamin E 400 UNIT capsule Take 400 Units by mouth daily.    Allergies  Allergen Reactions   Fish Allergy Anaphylaxis, Hives and Itching    Perch Fish only    SOCIAL HISTORY/FAMILY HISTORY   Reviewed in Epic:  Pertinent findings:  Social History   Social History Narrative   Now divorced, mother of 3.   Actively exercising, enjoys running and doing  aerobics at the gym..   She exercises in the gym and also runs outside.   Does not smoke, or drink alcohol.      Has recently moved into [place] having finalized her divorce.  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      OBJCTIVE -PE, EKG, labs   Wt Readings from Last 3 Encounters:  04/20/23 120 lb 9.6 oz (54.7 kg)  10/06/22 121 lb 9.6 oz (55.2 kg)  03/17/22 121 lb 12.8 oz (55.2 kg)    Physical Exam: BP 138/78 (BP Location: Right Arm, Patient Position: Sitting, Cuff Size: Normal)   Pulse 67   Ht 5\' 2"  (1.575 m)   Wt 120 lb 9.6 oz (54.7 kg)   SpO2 93%   BMI 22.06 kg/m  Physical Exam Vitals reviewed.  Constitutional:      General: She is not in acute distress.    Appearance: Normal appearance. She is normal weight. She is not ill-appearing (Well-nourished, well-groomed.  Looks well for stated age.) or toxic-appearing.  HENT:     Head: Normocephalic and atraumatic.  Neck:     Vascular: Carotid bruit present.  Cardiovascular:     Rate and Rhythm: Normal rate and  regular rhythm.     Pulses: Normal pulses.     Heart sounds: Normal heart sounds. No murmur heard.    No friction rub. No gallop.  Pulmonary:     Effort: Pulmonary effort is normal. No respiratory distress.     Breath sounds: Normal breath sounds. No wheezing, rhonchi or rales.  Musculoskeletal:        General: No swelling. Normal range of motion.     Cervical back: Normal range of motion and neck supple.  Skin:    General: Skin is warm and dry.  Neurological:     General: No focal deficit present.     Mental Status: She is alert and oriented to person, place, and time.     Gait: Gait normal.  Psychiatric:        Mood and Affect: Mood normal.        Behavior: Behavior normal.        Thought Content: Thought content normal.        Judgment: Judgment normal.     Adult ECG Report  Rate: 67;  Rhythm: normal sinus rhythm and normal axis, intervals durations. ;   Narrative Interpretation: Normal  Recent Labs: Reviewed. Lab Results  Component Value Date   CHOL 241 (H) 04/06/2023   HDL 64 04/06/2023   LDLCALC 128 (H) 04/06/2023   TRIG 278 (H) 04/06/2023   CHOLHDL 3.8 04/06/2023   Lab Results  Component Value Date   CREATININE 0.97 04/06/2023   BUN 12 04/06/2023   NA 138 04/06/2023   K 4.6 04/06/2023   CL 103 04/06/2023   CO2 24 04/06/2023      Latest Ref Rng & Units 01/15/2020    9:05 AM 04/12/2018    3:54 PM  CBC  WBC 3.4 - 10.8 x10E3/uL 5.7  15.2   Hemoglobin 11.1 - 15.9 g/dL 16.1  09.6   Hematocrit 34.0 - 46.6 % 40.7  37.0   Platelets 150 - 450 x10E3/uL 276  272     No results found for: "HGBA1C" Lab Results  Component Value Date   TSH 3.210 01/15/2020    ================================================== I spent a total of 27 minutes with the patient spent in direct patient consultation.  Additional time spent with chart review  / charting (studies, outside notes, etc): 10 min Total Time: 37 min  Current medicines are reviewed at length with the patient  today.  (+/- concerns) n/a  Notice: This dictation was prepared with Dragon dictation along with smart phrase technology. Any transcriptional errors that result from this process are unintentional and may not be corrected upon review.  Studies Ordered:   No orders of the defined types were placed in this encounter.  No orders of the defined types were placed in this encounter.   Patient Instructions / Medication Changes & Studies & Tests Ordered   Patient Instructions  Medication Instructions:   No changes  *If you need a refill on your cardiac medications before your next appointment, please call your pharmacy*   Lab Work: Not needed    Testing/Procedures:  Not needed  Follow-Up: At [facility], you and your health needs are our priority.  As part of our continuing mission to provide you with exceptional heart care, we have created designated Provider Care Teams.  These Care Teams include your primary Cardiologist (physician) and Advanced Practice Providers (APPs -  Physician Assistants and Nurse Practitioners) who all work together to provide you with the care you need, when you need it.     Your next appointment:   12 month(s)  The format for your next appointment:   In Person  Provider:   Bryan Lemma, MD       Marykay Lex, MD, MS Bryan Lemma, M.D., M.S. Interventional Cardiologist  [facility]  Pager # [phone] Phone # [phone] [address] [place], [place] [address]   Thank you for choosing [facility] [place]!!

## 2023-04-21 ENCOUNTER — Encounter: Payer: Self-pay | Admitting: Cardiology

## 2023-04-25 ENCOUNTER — Encounter: Payer: Self-pay | Admitting: Cardiology

## 2023-04-25 NOTE — Assessment & Plan Note (Signed)
Borderline blood pressure today.  Will continue to monitor, and pressures to start increase may want to consider either ARB or low-dose diuretic.

## 2023-04-25 NOTE — Assessment & Plan Note (Signed)
PCP follow-up thyroid levels.

## 2023-04-25 NOTE — Assessment & Plan Note (Signed)
Frequent PACs as well as runs of PSVT.  Symptoms seem to be pretty well-controlled on low-dose Toprol.  She has not had to use additional doses of.  Reiterated importance of staying adequately hydrated, vagal maneuvers and avoiding triggers.

## 2023-04-25 NOTE — Assessment & Plan Note (Signed)
Unfortunately, her lipids did not really get any better.  Mildly she actually tried the red yeast rice or co-Q10, and has not been doing as well with her exercise over the winter.  She is very reluctant to start statins and wants to try it again.  Based on Coronary Calcium Score being 0 we do have a little bit luxury of time.  However, I would not wait too long to consider treatment.  She was very reluctant to start rosuvastatin so we will hold off for now and discuss next visit.

## 2023-04-25 NOTE — Assessment & Plan Note (Deleted)
Borderline blood pressure today.  Will continue to monitor, and pressures to start increase may want to consider either ARB or low-dose diuretic. 

## 2023-04-29 ENCOUNTER — Other Ambulatory Visit: Payer: Self-pay | Admitting: Cardiology

## 2023-04-30 ENCOUNTER — Other Ambulatory Visit: Payer: Self-pay

## 2023-04-30 MED ORDER — METOPROLOL SUCCINATE ER 25 MG PO TB24: 25.0000 mg | ORAL_TABLET | Freq: Two times a day (BID) | ORAL | 11 refills | Status: DC

## 2023-05-26 ENCOUNTER — Encounter: Payer: Self-pay | Admitting: Podiatry

## 2023-06-01 DIAGNOSIS — L820 Inflamed seborrheic keratosis: Secondary | ICD-10-CM | POA: Diagnosis not present

## 2023-06-01 DIAGNOSIS — D225 Melanocytic nevi of trunk: Secondary | ICD-10-CM | POA: Diagnosis not present

## 2023-06-01 DIAGNOSIS — L814 Other melanin hyperpigmentation: Secondary | ICD-10-CM | POA: Diagnosis not present

## 2023-06-01 DIAGNOSIS — L821 Other seborrheic keratosis: Secondary | ICD-10-CM | POA: Diagnosis not present

## 2023-06-01 DIAGNOSIS — Z85828 Personal history of other malignant neoplasm of skin: Secondary | ICD-10-CM | POA: Diagnosis not present

## 2023-07-19 ENCOUNTER — Encounter: Payer: Self-pay | Admitting: Podiatry

## 2023-08-03 DIAGNOSIS — E041 Nontoxic single thyroid nodule: Secondary | ICD-10-CM | POA: Diagnosis not present

## 2023-08-03 DIAGNOSIS — Z1389 Encounter for screening for other disorder: Secondary | ICD-10-CM | POA: Diagnosis not present

## 2023-08-03 DIAGNOSIS — E785 Hyperlipidemia, unspecified: Secondary | ICD-10-CM | POA: Diagnosis not present

## 2023-08-04 LAB — LAB REPORT - SCANNED: EGFR: 64

## 2023-08-10 DIAGNOSIS — Z1339 Encounter for screening examination for other mental health and behavioral disorders: Secondary | ICD-10-CM | POA: Diagnosis not present

## 2023-08-10 DIAGNOSIS — H9312 Tinnitus, left ear: Secondary | ICD-10-CM | POA: Diagnosis not present

## 2023-08-10 DIAGNOSIS — E785 Hyperlipidemia, unspecified: Secondary | ICD-10-CM | POA: Diagnosis not present

## 2023-08-10 DIAGNOSIS — Z23 Encounter for immunization: Secondary | ICD-10-CM | POA: Diagnosis not present

## 2023-08-10 DIAGNOSIS — Z0000 Encounter for general adult medical examination without abnormal findings: Secondary | ICD-10-CM | POA: Diagnosis not present

## 2023-08-10 DIAGNOSIS — Z1331 Encounter for screening for depression: Secondary | ICD-10-CM | POA: Diagnosis not present

## 2023-08-10 DIAGNOSIS — J329 Chronic sinusitis, unspecified: Secondary | ICD-10-CM | POA: Diagnosis not present

## 2023-08-10 DIAGNOSIS — E041 Nontoxic single thyroid nodule: Secondary | ICD-10-CM | POA: Diagnosis not present

## 2023-08-24 DIAGNOSIS — E041 Nontoxic single thyroid nodule: Secondary | ICD-10-CM | POA: Diagnosis not present

## 2023-08-24 DIAGNOSIS — Z01419 Encounter for gynecological examination (general) (routine) without abnormal findings: Secondary | ICD-10-CM | POA: Diagnosis not present

## 2023-08-24 DIAGNOSIS — Z6821 Body mass index (BMI) 21.0-21.9, adult: Secondary | ICD-10-CM | POA: Diagnosis not present

## 2023-08-24 DIAGNOSIS — Z1211 Encounter for screening for malignant neoplasm of colon: Secondary | ICD-10-CM | POA: Diagnosis not present

## 2023-08-24 DIAGNOSIS — N959 Unspecified menopausal and perimenopausal disorder: Secondary | ICD-10-CM | POA: Diagnosis not present

## 2023-08-24 DIAGNOSIS — Z1231 Encounter for screening mammogram for malignant neoplasm of breast: Secondary | ICD-10-CM | POA: Diagnosis not present

## 2023-09-02 LAB — COLOGUARD: COLOGUARD: NEGATIVE

## 2023-09-02 LAB — EXTERNAL GENERIC LAB PROCEDURE: COLOGUARD: NEGATIVE

## 2023-09-22 DIAGNOSIS — H9312 Tinnitus, left ear: Secondary | ICD-10-CM | POA: Diagnosis not present

## 2023-09-22 DIAGNOSIS — H938X3 Other specified disorders of ear, bilateral: Secondary | ICD-10-CM | POA: Diagnosis not present

## 2023-09-22 DIAGNOSIS — H903 Sensorineural hearing loss, bilateral: Secondary | ICD-10-CM | POA: Diagnosis not present

## 2023-10-12 DIAGNOSIS — H43393 Other vitreous opacities, bilateral: Secondary | ICD-10-CM | POA: Diagnosis not present

## 2023-10-12 DIAGNOSIS — H1132 Conjunctival hemorrhage, left eye: Secondary | ICD-10-CM | POA: Diagnosis not present

## 2023-10-21 DIAGNOSIS — R490 Dysphonia: Secondary | ICD-10-CM | POA: Diagnosis not present

## 2023-10-21 DIAGNOSIS — J019 Acute sinusitis, unspecified: Secondary | ICD-10-CM | POA: Diagnosis not present

## 2023-10-21 DIAGNOSIS — R5383 Other fatigue: Secondary | ICD-10-CM | POA: Diagnosis not present

## 2023-10-21 DIAGNOSIS — Z1152 Encounter for screening for COVID-19: Secondary | ICD-10-CM | POA: Diagnosis not present

## 2023-10-21 DIAGNOSIS — R0981 Nasal congestion: Secondary | ICD-10-CM | POA: Diagnosis not present

## 2023-10-21 DIAGNOSIS — R051 Acute cough: Secondary | ICD-10-CM | POA: Diagnosis not present

## 2024-02-11 ENCOUNTER — Other Ambulatory Visit: Payer: Self-pay | Admitting: Cardiology

## 2024-03-16 ENCOUNTER — Encounter: Payer: Self-pay | Admitting: Cardiology

## 2024-05-02 ENCOUNTER — Other Ambulatory Visit: Payer: Self-pay | Admitting: *Deleted

## 2024-05-02 ENCOUNTER — Telehealth: Payer: Self-pay | Admitting: Cardiology

## 2024-05-02 DIAGNOSIS — E785 Hyperlipidemia, unspecified: Secondary | ICD-10-CM

## 2024-05-02 DIAGNOSIS — I471 Supraventricular tachycardia, unspecified: Secondary | ICD-10-CM

## 2024-05-02 DIAGNOSIS — R030 Elevated blood-pressure reading, without diagnosis of hypertension: Secondary | ICD-10-CM

## 2024-05-02 NOTE — Telephone Encounter (Signed)
 Patient wants to get lab orders for blood work prior to appointment on 8/25.

## 2024-05-02 NOTE — Telephone Encounter (Signed)
 Spoke to patient . Informed she can get labs prior to appointment in Aug  2025.  Direction given. Order placed. Verbalized understanding.

## 2024-05-02 NOTE — Telephone Encounter (Signed)
 Patient would like to know if Dr. Anner would like to check her cholesterol prior to her appointment this year or if she should wait to have it done with her PCP for her annual physical in October.

## 2024-05-16 ENCOUNTER — Telehealth: Payer: Self-pay | Admitting: Cardiology

## 2024-05-16 MED ORDER — METOPROLOL SUCCINATE ER 25 MG PO TB24: 25.0000 mg | ORAL_TABLET | Freq: Two times a day (BID) | ORAL | 0 refills | Status: DC

## 2024-05-16 NOTE — Telephone Encounter (Signed)
*  STAT* If patient is at the pharmacy, call can be transferred to refill team.   1. Which medications need to be refilled? (please list name of each medication and dose if known) metoprolol  succinate (TOPROL  XL) 25 MG 24 hr tablet    2. Would you like to learn more about the convenience, safety, & potential cost savings by using the [facility]? NO     3. Are you open to using the [facility] NO   4. Which pharmacy/location (including street and city if local pharmacy) is medication to be sent to?  [id number] - , [place] - [address] AT [facility] [address]     5. Do they need a 30 day or 90 day supply? 90

## 2024-05-16 NOTE — Telephone Encounter (Signed)
 Pt's medication was sent to pt's pharmacy as requested. Confirmation received.

## 2024-06-09 LAB — LIPID PANEL
Chol/HDL Ratio: 3.6 ratio (ref 0.0–4.4)
Cholesterol, Total: 242 mg/dL — ABNORMAL HIGH (ref 100–199)
HDL: 67 mg/dL (ref >39)
LDL Chol Calc (NIH): 152 mg/dL — ABNORMAL HIGH (ref 0–99)
Triglycerides: 133 mg/dL (ref 0–149)
VLDL Cholesterol Cal: 23 mg/dL (ref 5–40)

## 2024-06-09 LAB — COMPREHENSIVE METABOLIC PANEL WITH GFR
ALT: 15 IU/L (ref 0–32)
AST: 17 IU/L (ref 0–40)
Albumin: 4.1 g/dL (ref 3.8–4.8)
Alkaline Phosphatase: 68 IU/L (ref 44–121)
BUN/Creatinine Ratio: 16 (ref 12–28)
BUN: 16 mg/dL (ref 8–27)
Bilirubin Total: 0.3 mg/dL (ref 0.0–1.2)
CO2: 20 mmol/L (ref 20–29)
Calcium: 9.3 mg/dL (ref 8.7–10.3)
Chloride: 105 mmol/L (ref 96–106)
Creatinine, Ser: 0.98 mg/dL (ref 0.57–1.00)
Globulin, Total: 2.3 g/dL (ref 1.5–4.5)
Glucose: 89 mg/dL (ref 70–99)
Potassium: 4.2 mmol/L (ref 3.5–5.2)
Sodium: 139 mmol/L (ref 134–144)
Total Protein: 6.4 g/dL (ref 6.0–8.5)
eGFR: 61 mL/min/1.73 (ref >59)

## 2024-06-11 ENCOUNTER — Ambulatory Visit: Payer: Self-pay | Admitting: Cardiology

## 2024-06-19 ENCOUNTER — Encounter: Payer: Self-pay | Admitting: Cardiology

## 2024-06-19 ENCOUNTER — Ambulatory Visit: Attending: Cardiology | Admitting: Cardiology

## 2024-06-19 VITALS — BP 138/76 | HR 63 | Ht 62.0 in | Wt 124.2 lb

## 2024-06-19 DIAGNOSIS — I10 Essential (primary) hypertension: Secondary | ICD-10-CM

## 2024-06-19 DIAGNOSIS — E785 Hyperlipidemia, unspecified: Secondary | ICD-10-CM

## 2024-06-19 DIAGNOSIS — I471 Supraventricular tachycardia, unspecified: Secondary | ICD-10-CM

## 2024-06-19 DIAGNOSIS — R030 Elevated blood-pressure reading, without diagnosis of hypertension: Secondary | ICD-10-CM

## 2024-06-19 NOTE — Progress Notes (Addendum)
 Cardiology Office Note:  .   Date:  06/29/2024  ID:  Morgan [name], DOB August 06, 1951, MRN [id number] PCP: Larnell Hamilton, MD  [facility] Providers Cardiologist:  Alm Clay, MD     Chief Complaint  Patient presents with   Follow-up   Hypertension    BP borderline controlled; lipids not as well-controlled    Patient Profile: .     Morgan [name] is a relatively 73 y.o. female with a PMH notable for PSVT, Borderline HTN & HLD who presents here for ~ annual f/u at the request of Larnell Hamilton, MD.  Relatively healthy-appearing woman who appears much younger than stated age.  Long standing history of palpitations with short bursts of PSVT but none prolonged.  Well-controlled on beta-blocker.  Now with hypertension hyperlipidemia being less controlled.     Morgan [name] was last seen on April 19, 2024.  She is doing well.  No changes made.  Palpitations and SVT well-controlled on low-dose Toprol .  No requirement for additional dosing.  We did discuss trying lifestyle modification for her hyperlipidemia.  Plan was for annual follow-up.  Subjective  Discussed the use of AI scribe software for clinical note transcription with the patient, who gave verbal consent to proceed.  History of Present Illness Morgan [name] is a 73 year old female with hypertension and hyperlipidemia who presents for a follow-up on her cardiovascular health.  Her blood pressure at home was previously around 120/70 mmHg but has increased over the past five years. She has not been exercising regularly, which may be contributing to her elevated blood pressure. During the visit, her blood pressure was 138/76 mmHg.  She discusses her cholesterol levels, noting a total cholesterol of 240 mg/dL and an LDL of 849 mg/dL. Her coronary calcium score was zero in May 2023. Her gynecologist reduced her estrogen dose last fall, which she believes may have affected her LDL levels. She has since returned to her previous  estrogen dose.  She is currently on hormone replacement therapy and takes metoprolol  25 mg for palpitations, which are well-controlled. She occasionally takes an extra dose if needed, particularly after a recent increase in estrogen dose. No dizziness, chest pain, or significant palpitations recently, although she did experience some indigestion.  She wants to lose ten pounds, attributing some weight gain to reduced physical activity and possibly the change in her estrogen dose. She used to go to the gym daily but now only does stretching and light exercises at home. She also has a treadmill but rarely uses it.  She mentions taking a powdered collagen supplement and inquires about its safety, noting that she adds it to her coffee.     Objective   Pertinent meds include Toprol -XL 25 mg daily with additional dose PRN.  Estradiol 1 mg daily along with Prometrium 200 mg daily; Fish oil  12,000 mg caps daily.  Studies Reviewed: SABRA   EKG Interpretation Date/Time:  Monday June 19 2024 10:33:59 EDT Ventricular Rate:  63 PR Interval:  132 QRS Duration:  82 QT Interval:  466 QTC Calculation: 476 R Axis:   30  Text Interpretation: Sinus rhythm with Premature atrial complexes with Abberant conduction Possible Anterior infarct , age undetermined When compared with ECG of 20-Apr-2023 15:27, Premature atrial complexes Abberant conduction is now Present Confirmed by Clay Alm ([id number]) on 06/19/2024 11:23:52 AM    Results LABS Total cholesterol: 240 LDL: 150  RADIOLOGY Coronary calcium score: 0 (02/2022)  DIAGNOSTIC EKG: Premature atrial contractions (  PACs), one conducted abnormally, otherwise normal Echocardiogram: Normal pump function, normal wall motion, mild mitral regurgitation, mild tricuspid regurgitation (2012)  Lab Results  Component Value Date   CHOL 242 (H) 06/08/2024   HDL 67 06/08/2024   LDLCALC 152 (H) 06/08/2024   TRIG 133 06/08/2024   CHOLHDL 3.6 06/08/2024   Lab  Results  Component Value Date   NA 139 06/08/2024   K 4.2 06/08/2024   CREATININE 0.98 06/08/2024   EGFR 61 06/08/2024   GLUCOSE 89 06/08/2024    Risk Assessment/Calculations:         Physical Exam:   VS:  BP 138/76 (BP Location: Right Arm, Patient Position: Sitting, Cuff Size: Normal)   Pulse 63   Ht 5' 2 (1.575 m)   Wt 124 lb 3.2 oz (56.3 kg)   SpO2 95%   BMI 22.72 kg/m    Wt Readings from Last 3 Encounters:  06/19/24 124 lb 3.2 oz (56.3 kg)  04/20/23 120 lb 9.6 oz (54.7 kg)  10/06/22 121 lb 9.6 oz (55.2 kg)      GEN: Well nourished, well groomed healthy-appearing.  Looks younger than stated age.; in no acute distress;  NECK: No JVD; No carotid bruits CARDIAC: Normal S1, S2; RRR, no murmurs, rubs, gallops RESPIRATORY:  Clear to auscultation without rales, wheezing or rhonchi ; nonlabored, good air movement. ABDOMEN: Soft, non-tender, non-distended EXTREMITIES:  No edema; No deformity      ASSESSMENT AND PLAN: .    Problem List Items Addressed This Visit       Cardiology Problems   Hyperlipidemia with target LDL less than 100 (Chronic)   LDL cholesterol at 152 mg/dL requires intervention despite zero coronary calcium score. HRT offers no cardiovascular benefit and may increase clot risk. - Informed primary care provider of recent lipid and chemistry results. - Recheck labs, including liver function and lipids, in November to allow time for lifestyle edification. - If not at goal with LDL least < 130 would initiate low-dose rosuvastatin-start 20 mg.       Relevant Orders   Hepatic function panel   Lipid panel   Paroxysmal SVT (supraventricular tachycardia) (HCC) - Primary (Chronic)   No breakthrough spells of SVT that she can tell. Palpitations controlled with metoprolol  25 mg. EKG shows PACs. Recent estrogen increase may have transiently increased palpitations. - Continue Toprol -XL 25 mg with additional dose as needed for breakthrough spells. - Discussed  vagal maneuvers      Relevant Orders   EKG 12-Lead (Completed)   Hepatic function panel   Lipid panel     Other   Borderline hypertension (Chronic)   Blood pressure at 138/76 mmHg, previously 120/70 mmHg. Lack of exercise and dietary changes may contribute. She is motivated to avoid medication. - Encouraged regular exercise and healthy diet.      Relevant Orders   EKG 12-Lead (Completed)   Hepatic function panel   Lipid panel          Follow-Up: Return in about 1 year (around 06/19/2025) for Routine follow up with me, [employer].     Signed, Alm MICAEL Clay, MD, MS Alm Clay, M.D., M.S. Interventional Chartered certified accountant  Pager # [phone]

## 2024-06-19 NOTE — Patient Instructions (Addendum)
 Medication Instructions:   Pending Lab Results - if LDL note @ goal will Start Rousartan 10 mg  daily   *If you need a refill on your cardiac medications before your next appointment, please call your pharmacy*   Lab Work:  In Nov 2025 Lipid Hepatic  panel  If you have labs (blood work) drawn today and your tests are completely normal, you will receive your results only by: MyChart Message (if you have MyChart) OR A paper copy in the mail If you have any lab test that is abnormal or we need to change your treatment, we will call you to review the results.   Testing/Procedures: Not needed   Follow-Up: At [facility], you and your health needs are our priority.  As part of our continuing mission to provide you with exceptional heart care, we have created designated Provider Care Teams.  These Care Teams include your primary Cardiologist (physician) and Advanced Practice Providers (APPs -  Physician Assistants and Nurse Practitioners) who all work together to provide you with the care you need, when you need it.     Your next appointment:   12 month(s)  The format for your next appointment:   In Person  Provider:   Alm Clay, MD   Other Instructions    Just let your primary that you will  have  Lipid done  in 2025

## 2024-06-21 ENCOUNTER — Other Ambulatory Visit: Payer: Self-pay | Admitting: Cardiology

## 2024-06-26 ENCOUNTER — Encounter: Payer: Self-pay | Admitting: Cardiology

## 2024-06-26 NOTE — Assessment & Plan Note (Signed)
 No breakthrough spells of SVT that she can tell. Palpitations controlled with metoprolol  25 mg. EKG shows PACs. Recent estrogen increase may have transiently increased palpitations. - Continue Toprol -XL 25 mg with additional dose as needed for breakthrough spells. - Discussed vagal maneuvers

## 2024-06-26 NOTE — Assessment & Plan Note (Signed)
 Blood pressure at 138/76 mmHg, previously 120/70 mmHg. Lack of exercise and dietary changes may contribute. She is motivated to avoid medication. - Encouraged regular exercise and healthy diet.

## 2024-06-26 NOTE — Assessment & Plan Note (Addendum)
 LDL cholesterol at 152 mg/dL requires intervention despite zero coronary calcium score. HRT offers no cardiovascular benefit and may increase clot risk. - Informed primary care provider of recent lipid and chemistry results. - Recheck labs, including liver function and lipids, in November to allow time for lifestyle edification. - If not at goal with LDL least < 130 would initiate low-dose rosuvastatin-start 20 mg.

## 2024-06-28 ENCOUNTER — Telehealth: Payer: Self-pay | Admitting: Cardiology

## 2024-06-28 NOTE — Telephone Encounter (Signed)
 Patient states that Dr. Anner was supposed to call in Rosuvastatin in for her for her cholesterol but I don't see it on her medication list. I looked at his AVS on 08/25 and per the AVS it stated to recheck labs in November and then if the LDL is not better then he would prescribe the Rosuvastatin. I reiterated this to the patient but she says this is not how she understood it, she thought that he wanted her to start on the Rosuvastatin and then recheck labs in November to see how it was working. Please advise.

## 2024-06-28 NOTE — Telephone Encounter (Signed)
 Patient identification verified by 2 forms.   Called and spoke to patient  Patient states:  -She thought she was going to start the medications.  -I'm okay with diet modification and exercising. But I will take that medications if he thinks I should. -I don't want to influence hi one way or the other.   Patient denies:  -Wanting to take medications               Interventions/Plan: -AVS sates one thing, Dr. Genice note states another. -Unsure what pt should be doing. Will get message to Dr. Anner to clarify.   Reviewed ED warning signs/precautions  Patient agrees with plan, no questions at this time

## 2024-06-29 NOTE — Telephone Encounter (Signed)
 Brief Telephone Note  Patient called about discrepancy in AVS and plan.  The plan was to order labs for November and if labs not at goal, would then start rosuvastatin. AVS addended.  Alm Clay, MD

## 2024-06-29 NOTE — Telephone Encounter (Addendum)
 Brief telephone note  Patient called about discrepancy in AVS and plan.  The plan was to order labs for November and if labs not at goal, would then start rosuvastatin. AVS addended.  Alm Clay, MD   Alm Clay, MD

## 2024-08-07 LAB — LAB REPORT - SCANNED
EGFR (Non-African Amer.): 48.7
TSH: 2.91 (ref 0.41–5.90)

## 2024-08-23 ENCOUNTER — Ambulatory Visit: Payer: Self-pay | Admitting: Cardiology

## 2024-09-04 ENCOUNTER — Other Ambulatory Visit: Payer: Self-pay | Admitting: Obstetrics and Gynecology

## 2024-09-04 DIAGNOSIS — R928 Other abnormal and inconclusive findings on diagnostic imaging of breast: Secondary | ICD-10-CM

## 2024-09-09 ENCOUNTER — Ambulatory Visit

## 2024-09-09 ENCOUNTER — Ambulatory Visit
Admission: RE | Admit: 2024-09-09 | Discharge: 2024-09-09 | Disposition: A | Discharge status: Home / Self Care | Source: Ambulatory Visit | Attending: Obstetrics and Gynecology | Admitting: Obstetrics and Gynecology

## 2024-09-09 DIAGNOSIS — R928 Other abnormal and inconclusive findings on diagnostic imaging of breast: Secondary | ICD-10-CM

## 2024-09-18 ENCOUNTER — Other Ambulatory Visit

## 2024-09-18 ENCOUNTER — Encounter
